# Patient Record
Sex: Female | Born: 1975 | Race: Black or African American | Hispanic: No | Marital: Married | Smoking: Former smoker
Health system: Southern US, Community
[De-identification: ages and names within clinical notes are randomized; demographics above are authoritative.]

## PROBLEM LIST (undated history)

## (undated) DIAGNOSIS — IMO0002 Reserved for concepts with insufficient information to code with codable children: Secondary | ICD-10-CM

## (undated) DIAGNOSIS — I10 Essential (primary) hypertension: Secondary | ICD-10-CM

## (undated) DIAGNOSIS — R011 Cardiac murmur, unspecified: Secondary | ICD-10-CM

## (undated) DIAGNOSIS — J45909 Unspecified asthma, uncomplicated: Secondary | ICD-10-CM

## (undated) DIAGNOSIS — D649 Anemia, unspecified: Secondary | ICD-10-CM

## (undated) HISTORY — DX: Unspecified asthma, uncomplicated: J45.909

## (undated) HISTORY — DX: Cardiac murmur, unspecified: R01.1

## (undated) HISTORY — PX: MYOMECTOMY: SHX85

## (undated) HISTORY — DX: Essential (primary) hypertension: I10

---

## 2006-06-26 ENCOUNTER — Emergency Department: Payer: Self-pay | Admitting: Emergency Medicine

## 2006-07-14 ENCOUNTER — Ambulatory Visit: Payer: Self-pay | Admitting: Internal Medicine

## 2006-07-26 ENCOUNTER — Ambulatory Visit: Payer: Self-pay | Admitting: Internal Medicine

## 2006-08-25 ENCOUNTER — Ambulatory Visit: Payer: Self-pay | Admitting: Internal Medicine

## 2006-09-25 ENCOUNTER — Ambulatory Visit: Payer: Self-pay | Admitting: Internal Medicine

## 2006-10-25 ENCOUNTER — Ambulatory Visit: Payer: Self-pay | Admitting: Internal Medicine

## 2007-08-27 ENCOUNTER — Ambulatory Visit: Payer: Self-pay | Admitting: Family Medicine

## 2007-11-08 ENCOUNTER — Emergency Department: Payer: Self-pay | Admitting: Internal Medicine

## 2008-04-12 ENCOUNTER — Inpatient Hospital Stay (HOSPITAL_COMMUNITY): Admission: RE | Admit: 2008-04-12 | Discharge: 2008-04-15 | Payer: Self-pay | Admitting: Obstetrics

## 2010-07-20 ENCOUNTER — Ambulatory Visit: Payer: Self-pay | Admitting: Internal Medicine

## 2010-09-08 NOTE — Op Note (Signed)
Monica Simmons, HAST NO.:  [id number]   MEDICAL RECORD NO.:  [id number]          PATIENT TYPE:  INP   LOCATION:  9147                          FACILITY:  [facility]   PHYSICIAN:  Lendon Colonel, MD   DATE OF BIRTH:  22-Oct-1975   DATE OF PROCEDURE:  DATE OF DISCHARGE:                               OPERATIVE REPORT   PREOPERATIVE DIAGNOSES:  Breech intrauterine pregnancy at 38 weeks and 1  day, prior extensive myomectomy.   POSTOPERATIVE DIAGNOSES:  Breech intrauterine pregnancy at 38 weeks and  1 day, prior extensive myomectomy.   PROCEDURE:  1. Primary low transverse cesarean section.  2. Extensive lysis of adhesions.   SURGEON:  Lendon Colonel, MD   ASSISTANT:  Eliberto Ivory. Rosalio Macadamia, MD   SECONDARY ASSISTANT:  Genia Del, MD   ANESTHESIA:  Spinal.   FINDINGS:  Female infant in the RST position, Apgars 8 and 9, 6 pounds 13  ounces.  Normal placenta.  Distorted uterine cavity with multiple  subserosal, submucosal, and intramural fibroids, thickened fibrotic  myometrium with excessive scar tissue within the myometrium.  Dense  adhesions between the uterus and the anterior abdominal wall, the  bladder to the uterus, the bladder to the anterior abdominal wall.  Filmy bowel adhesions.  Normal placenta, clear amniotic fluid, normal  tubes and ovaries.   SPECIMENS:  Placenta to Labor and Delivery.   FINDINGS:  As above.   ANTIBIOTICS:  Ancef 2 g.   ESTIMATED BLOOD LOSS:  900 mL.   COMPLICATIONS:  None.   PROCEDURE PERFORMED:  After informed consent was obtained for the  procedure and risks including excessive bleeding, need for blood  transfusion, hysterectomy were discussed with the patient.  The patient  was taken to the operating room where spinal anesthesia was administered  without difficulty.  She was prepped and draped in the normal sterile  fashion in the dorsal supine position with a leftward tilt.  A Foley  catheter was inserted  sterilely into the bladder.  A Pfannenstiel skin  incision was made 2 cm above the pubic symphysis with the scalpel and  carried through to the underlying layer of fascia with the Bovie  cautery.  The fascia was incised in the midline.  The incision extended  laterally with the Mayo scissors.  No clear midline from the rectus  muscles were noted; however, dense adhesions were noted between the  rectus muscles.  Pickups and sharp dissection with the knife were used  to better identify any planes.  The superior aspect of fascia incision  was grasped with Kocher clamps, elevated up, and the rectus muscles were  separated off sharply.  The inferior aspect of the fascial incision was  also grasped with the Kocher clamps, and the rectus muscles were  dissected off sharply with the Mayo scissors.  Again, attempt at  dividing the rectus muscles was done very carefully.  Some superior  dissection was done.  Some dense bands were noted.  The combination of  blunt and sharp dissection was done to enter the peritoneal cavity.  During this dissection, it was  thought that mostly uterine serosa and  myometrium was being divided.  Before each cut, the care was taken to  ensure no bowel was present.  For the inferior dissection, again care  was taken to ensure the bladder was free.  The bladder was dissected  both bluntly and sharply.  Once ensuring nothing was under a very large  adhesion between the uterus in the anterior abdominal wall, this was  taken down with a combination of Mayo scissors, Bovie cautery, and  scalpel.  Approximately an additional 40 minutes was done in this  careful lysis of adhesions.  Once adequate room was noted, the  vesicouterine peritoneum was grasped with pickups, entered sharply, and  extended laterally.  The bladder flap was created digitally.  The  bladder blade was reinserted.  Manual exam of the uterus to assess the  position of the fetus was done.  A preoperative  ultrasound confirmed  breech position, and due to the thickness of the uterine myometrium and  the multiple fibroids, it was difficult to identify fetal parts in the  uterus.  Position of the vessels were noted, however.  The lower uterine  segment was incised in a transverse fashion with the scalpel.  This was  quite thick, and Allis clamps had to be placed on the inferior and  superior muscle edges.  These were held in traction to continue the  dissection down to the uterine and the amniotic cavity.  Once entered,  the uterine incision was extended anterior and laterally with the  bandage scissors with care being taken to avoid cutting into the  fibroids.  The amniotic sac was then ruptured, and clear fluid was  noted. In an attempt to deliver the baby, the right arm was presented,  this was switched back into the uterus, and the baby was delivered in  the frank breech position.  After delivery of the sacrum in the LST  position, the baby was rotated with the left arm swept across the chest  and then the right arm delivered and the head delivered in traction.  The cord was clamped and cut.  Infant was taken to the waiting  pediatrician.  The placenta was expressed.  The uterus was unable to be  delivered due to  the adhesions of the posterior uterus to the small  intestines.  The bladder blade was reinserted.  The uterus was cleaned  with a dry lap sponge, and retractors were used along with T clamps  along the uterine incision.  Uterine incision was repaired with 0 Vicryl  in a running locked fashion.  A second layer of the same suture was used  in a vertical imbricating fashion.  Good hemostasis of the uterine  incision was noted.  The left lateral segments extending from the  uterine incision up towards the fundus in a vertical fashion  approximately a 4-cm defect was noted in the myometrium with brisk  bleeding.  This was repaired with a running locked suture of 0 Vicryl.   Additional 4 figure-of-eight sutures were used to bring this myometrium  together for  good hemostasis throughout the anterior surface of the  uterus.  Diffuse bleeding was noted from the multiple lysis of  adhesions. An additional 30 minutes were spent in obtaining hemostasis  with a combination of figure-of-eight sutures and Bovie cautery, and  total of additional 8 sutures in figure-of-eight fashion were sewn in  the uterine muscle for hemostasis.  The pelvis was irrigated with warm  normal saline.  The bladder flap was inspected and found to be  hemostatic.  The bladder was then backfilled with 400 mL of saline  tinged with methylene blue.  No defects were noted in the bladder.  A  piece of Surgicel was placed over the anterior uterine serosa and  hemostasis noted.  Additional piece of Surgicel was placed in the lower  uterine segment especially on the right edge where the bladder was taken  off sharply to control some hemostasis.  Given the amount of scar  tissue, a piece of Interceed was then laid on top of this.  Hemostasis  was noted.  The rectus muscles were reapproximated with four interrupted  loose 2-0 Vicryl sutures.  The fascia was closed with 0 Vicryl in  a running fashion and two half of subcutaneous tissue was irrigated.  The subcutaneous space was closed with a 2-0 plain gut on a large  needle, and the skin was closed with staples.  The patient tolerated the  procedure well.  Sponge, lap, and needle counts were correct x3.  The  patient was taken to the recovery room in stable condition.      Lendon Colonel, MD  Electronically Signed     KAF/MEDQ  D:  04/12/2008  T:  04/13/2008  Job:  [id number]

## 2010-09-11 NOTE — Discharge Summary (Signed)
Monica Simmons, Monica Simmons NO.:  [id number]   MEDICAL RECORD NO.:  [id number]          PATIENT TYPE:  INP   LOCATION:  9147                          FACILITY:  [facility]   PHYSICIAN:  Lendon Colonel, MD   DATE OF BIRTH:  02-06-1976   DATE OF ADMISSION:  04/12/2008  DATE OF DISCHARGE:  04/15/2008                               DISCHARGE SUMMARY   CHIEF COMPLAINT:  Here for primary cesarean section.   HISTORY OF PRESENT ILLNESS:  This is a 35 year old G1 at 54 weeks and 1  day with history of prior myomectomy with 25 fibroid removal here for  primary cesarean section given the risks of uterine rupture with  contractions.   PAST MEDICAL HISTORY:  Significant for fibroids, anemia, increased  weight gain in pregnancy, chronic hypertension, and breech presentation.  Socially married with no toxic habits.  No known drug allergies.   MEDICATIONS:  Prenatal vitamins and iron on admission.   She was afebrile with stable vital signs.  Her abdomen is gravid,  enlarged, nontender.  The patient was admitted for a c-section for  breech with prior myomectomy.  The patient did well, had an  uncomplicated cesarean section that can be found in a separate operative  note.  In short, she had a baby in the LST position with Apgars 8/9,  weight 6 pounds 13 ounces with extensive pelvic adhesions.  Postoperatively, the patient did well and by postoperative day #3, the  patient was ambulating and voiding.  She had stable vital signs.  Her  pain was controlled with p.o. medicine.  She did have some dependent  edema in the pannus and some 2+ lower extremity edema; however, the  patient was discharged to home with plans for followup in the office for  staple removal.   DISCHARGE MEDICATIONS:  Motrin, Percocet, and Colace.   DISCHARGE INSTRUCTIONS:  Return for staple removal few days after the  discharge.   DISCHARGE CONDITION:  Stable.   DISCHARGE DISPOSITION:  Home.   DISCHARGE DIAGNOSIS:   Status post primary cesarean section.      Lendon Colonel, MD  Electronically Signed     KAF/MEDQ  D:  05/26/2008  T:  05/26/2008  Job:  [id number]

## 2010-12-21 ENCOUNTER — Encounter: Payer: Self-pay | Admitting: Family Medicine

## 2010-12-21 ENCOUNTER — Ambulatory Visit (INDEPENDENT_AMBULATORY_CARE_PROVIDER_SITE_OTHER): Payer: BC Managed Care – PPO | Admitting: Family Medicine

## 2010-12-21 VITALS — BP 110/78 | HR 86 | Temp 97.8°F | Ht 61.0 in | Wt 201.8 lb

## 2010-12-21 DIAGNOSIS — Z0000 Encounter for general adult medical examination without abnormal findings: Secondary | ICD-10-CM

## 2010-12-21 DIAGNOSIS — Q838 Other congenital malformations of breast: Secondary | ICD-10-CM

## 2010-12-21 DIAGNOSIS — Z136 Encounter for screening for cardiovascular disorders: Secondary | ICD-10-CM

## 2010-12-21 NOTE — Progress Notes (Signed)
  Subjective:    Patient ID: Monica Simmons, female    DOB: [birth year], 35 y.o.   MRN: [id number]  HPI  35 yo here to establish care.  G2P1- 2 yo son. Sees Dr. Payton Doughty. UTD with pap smear. She and her husband are trying to get pregnant, not using any contraception.  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 Good supportive home environment.  Left breast accessory fatty tissue-  Has been growing in size since college. Not painful.      Review of Systems See HPI Patient reports no  vision/ hearing changes,anorexia, weight change, fever ,adenopathy, persistant / recurrent hoarseness, swallowing issues, chest pain, edema,persistant / recurrent cough, hemoptysis, dyspnea(rest, exertional, paroxysmal nocturnal), gastrointestinal  bleeding (melena, rectal bleeding), abdominal pain, excessive heart burn, GU symptoms(dysuria, hematuria, pyuria, voiding/incontinence  Issues) syncope, focal weakness, severe memory loss, concerning skin lesions, depression, anxiety, abnormal bruising/bleeding, major joint swelling, breast masses or abnormal vaginal bleeding.       Objective:   Physical Exam BP 110/78  Pulse 86  Temp(Src) 97.8 F (36.6 C) (Oral)  Ht 5\' 1"  (1.549 m)  Wt 201 lb 12 oz (91.513 kg)  BMI 38.12 kg/m2  LMP 12/19/2010  General:  Well-developed,overweight appearing,in no acute distress; alert,appropriate and cooperative throughout examination Head:  normocephalic and atraumatic.   Eyes:  vision grossly intact, pupils equal, pupils round, and pupils reactive to light.   Ears:  R ear normal and L ear normal.   Nose:  no external deformity.   Mouth:  good dentition.   Neck:  No deformities, masses, or tenderness noted. Breasts:  Adjacent to left breast, under axilla- accessory fatty tissue, non tender to palpation Lungs:  Normal respiratory effort, chest expands symmetrically. Lungs are clear to auscultation, no crackles or wheezes. Heart:  Normal rate and regular rhythm. S1 and S2 normal  without gallop, murmur, click, rub or other extra sounds. Abdomen:  Bowel sounds positive,abdomen soft and non-tender without masses, organomegaly or hernias noted. Msk:  No deformity or scoliosis noted of thoracic or lumbar spine.   Extremities:  No clubbing, cyanosis, edema, or deformity noted with normal full range of motion of all joints.   Neurologic:  alert & oriented X3 and gait normal.   Skin:  Intact without suspicious lesions or rashes Cervical Nodes:  No lymphadenopathy noted Axillary Nodes:  No palpable lymphadenopathy Psych:  Cognition and judgment appear intact. Alert and cooperative with normal attention span and concentration. No apparent delusions, illusions, hallucinations        Assessment & Plan:   1. Routine general medical examination at a health care facility  Reviewed preventive care protocols, scheduled due services, and updated immunizations Discussed nutrition, exercise, diet, and healthy lifestyle.   Basic Metabolic Panel (BMET)  2. Ectopic breast tissue   Will refer to general surgery for removal. Ambulatory referral to General Surgery

## 2010-12-21 NOTE — Patient Instructions (Signed)
Great to meet you. Please stop by to see Monica Simmons on your way out. 

## 2010-12-22 LAB — LIPID PANEL
Cholesterol: 116 mg/dL (ref 0–200)
LDL Cholesterol: 61 mg/dL (ref 0–99)

## 2010-12-22 LAB — BASIC METABOLIC PANEL
BUN: 10 mg/dL (ref 6–23)
Chloride: 108 mEq/L (ref 96–112)
Creatinine, Ser: 0.6 mg/dL (ref 0.4–1.2)
GFR: 122.33 mL/min (ref >60.00)

## 2011-01-12 ENCOUNTER — Ambulatory Visit (INDEPENDENT_AMBULATORY_CARE_PROVIDER_SITE_OTHER): Payer: BC Managed Care – PPO | Admitting: General Surgery

## 2011-01-12 ENCOUNTER — Other Ambulatory Visit (INDEPENDENT_AMBULATORY_CARE_PROVIDER_SITE_OTHER): Payer: Self-pay | Admitting: General Surgery

## 2011-01-12 DIAGNOSIS — M549 Dorsalgia, unspecified: Secondary | ICD-10-CM

## 2011-01-29 LAB — CROSSMATCH: Antibody Screen: NEGATIVE

## 2011-01-29 LAB — ABO/RH: ABO/RH(D): O POS

## 2011-01-29 LAB — CREATININE, SERUM: GFR calc non Af Amer: >60 mL/min (ref >60)

## 2011-01-29 LAB — CBC
HCT: 27.4 % — ABNORMAL LOW (ref 36.0–46.0)
HCT: 27.8 % — ABNORMAL LOW (ref 36.0–46.0)
HCT: 31.2 % — ABNORMAL LOW (ref 36.0–46.0)
HCT: 35.9 % — ABNORMAL LOW (ref 36.0–46.0)
Hemoglobin: 10.4 g/dL — ABNORMAL LOW (ref 12.0–15.0)
Hemoglobin: 9.1 g/dL — ABNORMAL LOW (ref 12.0–15.0)
Hemoglobin: 9.3 g/dL — ABNORMAL LOW (ref 12.0–15.0)
MCHC: 33.4 g/dL (ref 30.0–36.0)
MCV: 85.1 fL (ref 78.0–100.0)
MCV: 85.2 fL (ref 78.0–100.0)
MCV: 85.3 fL (ref 78.0–100.0)
Platelets: 223 10*3/uL (ref 150–400)
RBC: 3.67 MIL/uL — ABNORMAL LOW (ref 3.87–5.11)
RDW: 14.5 % (ref 11.5–15.5)
RDW: 15.2 % (ref 11.5–15.5)
WBC: 14 10*3/uL — ABNORMAL HIGH (ref 4.0–10.5)
WBC: 9 10*3/uL (ref 4.0–10.5)

## 2011-01-29 LAB — URINALYSIS, ROUTINE W REFLEX MICROSCOPIC
Glucose, UA: NEGATIVE mg/dL
Protein, ur: NEGATIVE mg/dL

## 2011-05-10 LAB — OB RESULTS CONSOLE HIV ANTIBODY (ROUTINE TESTING): HIV: NONREACTIVE

## 2011-05-10 LAB — OB RESULTS CONSOLE ABO/RH

## 2011-05-10 LAB — OB RESULTS CONSOLE ANTIBODY SCREEN: Antibody Screen: NEGATIVE

## 2011-08-03 ENCOUNTER — Other Ambulatory Visit (HOSPITAL_COMMUNITY): Payer: Self-pay | Admitting: Obstetrics

## 2011-08-03 ENCOUNTER — Other Ambulatory Visit: Payer: Self-pay

## 2011-08-03 DIAGNOSIS — O2690 Pregnancy related conditions, unspecified, unspecified trimester: Secondary | ICD-10-CM

## 2011-08-03 DIAGNOSIS — Z3689 Encounter for other specified antenatal screening: Secondary | ICD-10-CM

## 2011-08-04 ENCOUNTER — Encounter (HOSPITAL_COMMUNITY): Payer: Self-pay | Admitting: Obstetrics

## 2011-08-17 ENCOUNTER — Ambulatory Visit (HOSPITAL_COMMUNITY)
Admission: RE | Admit: 2011-08-17 | Discharge: 2011-08-17 | Disposition: A | Discharge status: Home / Self Care | Payer: BC Managed Care – PPO | Source: Ambulatory Visit | Attending: Obstetrics | Admitting: Obstetrics

## 2011-08-17 ENCOUNTER — Encounter (HOSPITAL_COMMUNITY): Payer: Self-pay

## 2011-08-17 ENCOUNTER — Ambulatory Visit (HOSPITAL_COMMUNITY): Payer: BC Managed Care – PPO

## 2011-08-17 ENCOUNTER — Other Ambulatory Visit (HOSPITAL_COMMUNITY): Payer: BC Managed Care – PPO

## 2011-08-17 DIAGNOSIS — O358XX0 Maternal care for other (suspected) fetal abnormality and damage, not applicable or unspecified: Secondary | ICD-10-CM | POA: Insufficient documentation

## 2011-08-17 DIAGNOSIS — O10019 Pre-existing essential hypertension complicating pregnancy, unspecified trimester: Secondary | ICD-10-CM | POA: Insufficient documentation

## 2011-08-17 DIAGNOSIS — O2690 Pregnancy related conditions, unspecified, unspecified trimester: Secondary | ICD-10-CM

## 2011-08-17 DIAGNOSIS — Z3689 Encounter for other specified antenatal screening: Secondary | ICD-10-CM

## 2011-08-17 DIAGNOSIS — O3410 Maternal care for benign tumor of corpus uteri, unspecified trimester: Secondary | ICD-10-CM | POA: Insufficient documentation

## 2011-08-17 DIAGNOSIS — O34219 Maternal care for unspecified type scar from previous cesarean delivery: Secondary | ICD-10-CM | POA: Insufficient documentation

## 2011-08-17 HISTORY — DX: Anemia, unspecified: D64.9

## 2011-08-17 NOTE — Progress Notes (Addendum)
Genetic Counseling  High-Risk Gestation Note  Appointment Date:  08/17/2011 Referred By: Noland Fordyce, MD Date of Birth:  Sep 20, 1975    Pregnancy History: [id number] Estimated Date of Delivery: 12/16/11 Estimated Gestational Age: [redacted]w[redacted]d Attending: Particia Nearing, MD  Monica Simmons, was seen for genetic counseling given the previous ultrasound findings of echogenic intracardiac focus and pyelectasis.   Ultrasound performed today confirmed the pregnancy to be [redacted]w[redacted]d and confirmed the findings of echogenic intracardiac focus and bilateral pyelectasis. Remaining visualized fetal anatomy appeared normal. Complete ultrasound results reported separately.   An isolated echogenic intracardiac focus (EIF) is generally believed to be a normal variation without any concerns for the pregnancy.  Isolated echogenic cardiac foci are not associated with congenital heart defects in the baby or compromised cardiac function after birth.  However, an echogenic cardiac focus is associated with a slightly increased chance for Down syndrome in the pregnancy when additional risk factors for aneuploidy are present. We discussed that fetal pyelectasis is defined as the dilatation of the fetal renal pelvis/pelvises due to excess urine. This finding is estimated to occur in 2-3% of fetuses.  The female to female ratio is 2:1.  Typically, babies with mild pyelectasis are born normal and healthy and we are usually unable to determine why this extra fluid is present.  This urine accumulation may regress, stay the same or continue to accumulate.  The more fluid that accumulates, the more likely this fluid could be the result of a compromise in kidney function, an obstruction, or narrowing of the ureters which transport urine out of the body, thus causing backflow of fluid into the kidneys.  Therefore, it is important to follow pyelectasis to make sure it does not become more concerning.  Also, in some cases postnatal evaluation of  baby's kidneys may be warranted.  We discussed that the finding of pyelectasis is associated with an increased risk for fetal aneuploidy.  This risk is highest when other anomalies or fetal differences are visualized.  We reviewed chromosomes, nondisjunction, and Down syndrome including the common features and variable prognosis. We also reviewed Monica Simmons's first trimester screen result and the associated reduction in risks for fetal Down syndrome (1 in 362 to 1 in 7221) and trisomy 18/13 (1 in 629 to 1 in >10,000).  She understands that first trimester screening provides a pregnancy specific risk for these conditions, but is not considered to be diagnostic.    She was counseled regarding other available screening and diagnostic options including cell free fetal DNA testing and amniocentesis.  The risks, benefits, and limitations of each of these options were reviewed in detail. We reviewed that the findings of  EIF and pyelectasis would be associated with an increased risk for Down syndrome above the patient's first trimester screen result of 1 in 7,221. However, the adjusted risk is still less than the patient's a priori risk based on age and is less than the complication risk associated with amniocentesis.  After thoughtful consideration of these options, she elected to proceed with ultrasound, but declined amniocentesis and cell free fetal DNA testing.  She understands that screening tests cannot rule out all birth defects or genetic syndromes.  Monica Simmons was provided with written information regarding sickle cell anemia (SCA) including the carrier frequency and incidence in the African-American population, the availability of carrier testing and prenatal diagnosis if indicated.  In addition, we discussed that hemoglobinopathies are routinely screened for as part of the [place] newborn screening panel.  She declined  additional discussion of sickle cell screening today.   Both family histories  were reviewed and found to be noncontributory for birth defects, mental retardation, and known genetic conditions. Without further information regarding the provided family history, an accurate genetic risk cannot be calculated. Further genetic counseling is warranted if more information is obtained.  Monica Simmons denied exposure to environmental toxins or chemical agents. She denied the use of alcohol, tobacco or street drugs. She denied significant viral illnesses during the course of her pregnancy. Her medical and surgical histories were noncontributory.    I counseled Monica Simmons regarding the above risks and available options.  The approximate face-to-face time with the genetic counselor was 16 minutes.  Quinn Plowman, MS Certified Genetic Counselor 08/17/2011

## 2011-08-20 ENCOUNTER — Ambulatory Visit (HOSPITAL_COMMUNITY): Payer: BC Managed Care – PPO

## 2011-08-23 ENCOUNTER — Other Ambulatory Visit (HOSPITAL_COMMUNITY): Payer: BC Managed Care – PPO

## 2011-08-24 ENCOUNTER — Ambulatory Visit (HOSPITAL_COMMUNITY)
Admission: RE | Admit: 2011-08-24 | Discharge: 2011-08-24 | Disposition: A | Discharge status: Home / Self Care | Payer: BC Managed Care – PPO | Source: Ambulatory Visit | Attending: Obstetrics | Admitting: Obstetrics

## 2011-08-24 ENCOUNTER — Other Ambulatory Visit: Payer: Self-pay

## 2011-08-24 DIAGNOSIS — O10019 Pre-existing essential hypertension complicating pregnancy, unspecified trimester: Secondary | ICD-10-CM | POA: Insufficient documentation

## 2011-08-24 DIAGNOSIS — O3410 Maternal care for benign tumor of corpus uteri, unspecified trimester: Secondary | ICD-10-CM | POA: Insufficient documentation

## 2011-08-24 DIAGNOSIS — O34219 Maternal care for unspecified type scar from previous cesarean delivery: Secondary | ICD-10-CM | POA: Insufficient documentation

## 2011-08-24 DIAGNOSIS — O358XX0 Maternal care for other (suspected) fetal abnormality and damage, not applicable or unspecified: Secondary | ICD-10-CM | POA: Insufficient documentation

## 2011-09-03 ENCOUNTER — Telehealth (HOSPITAL_COMMUNITY): Payer: Self-pay | Admitting: MS"

## 2011-09-03 NOTE — Telephone Encounter (Signed)
Called Monica Simmons to discuss her [facility], cell free fetal DNA testing.  We reviewed that these are within normal limits, showing a less than 1 in 10,000 risk for trisomies 21, 18 and 13.  We reviewed that this testing identifies > 99% of pregnancies with trisomy 21, >97% of pregnancies with trisomy 2, and >80% with trisomy 25; the false positive rate is <0.1% for all conditions.  She understands that this testing does not identify all genetic conditions.  All questions were answered to her satisfaction, she was encouraged to call with additional questions or concerns.  Quinn Plowman, MS Patent attorney

## 2011-09-28 ENCOUNTER — Encounter: Payer: Self-pay | Admitting: *Deleted

## 2011-11-03 ENCOUNTER — Other Ambulatory Visit: Payer: Self-pay

## 2011-11-04 ENCOUNTER — Other Ambulatory Visit (HOSPITAL_COMMUNITY): Payer: Self-pay | Admitting: Obstetrics

## 2011-11-04 DIAGNOSIS — O4440 Low lying placenta NOS or without hemorrhage, unspecified trimester: Secondary | ICD-10-CM

## 2011-11-08 ENCOUNTER — Ambulatory Visit (HOSPITAL_COMMUNITY): Payer: BC Managed Care – PPO

## 2011-11-11 ENCOUNTER — Ambulatory Visit (HOSPITAL_COMMUNITY)
Admission: RE | Admit: 2011-11-11 | Discharge: 2011-11-11 | Disposition: A | Discharge status: Home / Self Care | Payer: BC Managed Care – PPO | Source: Ambulatory Visit | Attending: Obstetrics | Admitting: Obstetrics

## 2011-11-11 ENCOUNTER — Other Ambulatory Visit (HOSPITAL_COMMUNITY): Payer: Self-pay | Admitting: Obstetrics

## 2011-11-11 DIAGNOSIS — O10019 Pre-existing essential hypertension complicating pregnancy, unspecified trimester: Secondary | ICD-10-CM | POA: Insufficient documentation

## 2011-11-11 DIAGNOSIS — O4440 Low lying placenta NOS or without hemorrhage, unspecified trimester: Secondary | ICD-10-CM

## 2011-11-11 DIAGNOSIS — O3410 Maternal care for benign tumor of corpus uteri, unspecified trimester: Secondary | ICD-10-CM | POA: Insufficient documentation

## 2011-11-11 DIAGNOSIS — O358XX0 Maternal care for other (suspected) fetal abnormality and damage, not applicable or unspecified: Secondary | ICD-10-CM | POA: Insufficient documentation

## 2011-11-11 DIAGNOSIS — O34219 Maternal care for unspecified type scar from previous cesarean delivery: Secondary | ICD-10-CM | POA: Insufficient documentation

## 2011-11-18 ENCOUNTER — Other Ambulatory Visit: Payer: Self-pay | Admitting: Obstetrics

## 2011-11-21 ENCOUNTER — Encounter (HOSPITAL_COMMUNITY): Payer: Self-pay

## 2011-11-22 ENCOUNTER — Encounter (HOSPITAL_COMMUNITY)
Admission: RE | Admit: 2011-11-22 | Discharge: 2011-11-22 | Disposition: A | Discharge status: Home / Self Care | Payer: BC Managed Care – PPO | Source: Ambulatory Visit | Attending: Obstetrics | Admitting: Obstetrics

## 2011-11-22 ENCOUNTER — Encounter (HOSPITAL_COMMUNITY): Payer: Self-pay

## 2011-11-22 LAB — CBC
HCT: 32.3 % — ABNORMAL LOW (ref 36.0–46.0)
MCV: 78.4 fL (ref 78.0–100.0)
RDW: 21.8 % — ABNORMAL HIGH (ref 11.5–15.5)
WBC: 8.6 10*3/uL (ref 4.0–10.5)

## 2011-11-22 LAB — SURGICAL PCR SCREEN
MRSA, PCR: POSITIVE — AB
Staphylococcus aureus: POSITIVE — AB

## 2011-11-22 NOTE — Patient Instructions (Addendum)
20 Monica Simmons  11/22/2011   Your procedure is scheduled on:  11/29/11  Enter through the Main Entrance of [facility] at 1000 AM.  Pick up the phone at the desk and dial [phone].   Call this number if you have problems the morning of surgery: [phone]   Remember:   Do not eat food:After Midnight.  Do not drink clear liquids: After Midnight.  Take these medicines the morning of surgery with A SIP OF WATER: NA   Do not wear jewelry, make-up or nail polish.  Do not wear lotions, powders, or perfumes. You may wear deodorant.  Do not shave 48 hours prior to surgery.  Do not bring valuables to the hospital.  Contacts, dentures or bridgework may not be worn into surgery.  Leave suitcase in the car. After surgery it may be brought to your room.  For patients admitted to the hospital, checkout time is 11:00 AM the day of discharge.   Patients discharged the day of surgery will not be allowed to drive home.  Name and phone number of your driver: NA  Special Instructions: CHG Shower Use Special Wash: 1/2 bottle night before surgery and 1/2 bottle morning of surgery.   Please read over the following fact sheets that you were given: MRSA Information

## 2011-11-23 LAB — RPR: RPR Ser Ql: NONREACTIVE

## 2011-11-29 ENCOUNTER — Inpatient Hospital Stay (HOSPITAL_COMMUNITY): Payer: BC Managed Care – PPO | Admitting: Anesthesiology

## 2011-11-29 ENCOUNTER — Encounter (HOSPITAL_COMMUNITY): Payer: Self-pay

## 2011-11-29 ENCOUNTER — Encounter (HOSPITAL_COMMUNITY)
Admission: RE | Disposition: A | Discharge status: Home / Self Care | Payer: Self-pay | Source: Ambulatory Visit | Attending: Obstetrics

## 2011-11-29 ENCOUNTER — Inpatient Hospital Stay (HOSPITAL_COMMUNITY)
Admission: RE | Admit: 2011-11-29 | Discharge: 2011-12-02 | DRG: 371 | Disposition: A | Discharge status: Home / Self Care | Payer: BC Managed Care – PPO | Source: Ambulatory Visit | Attending: Obstetrics | Admitting: Obstetrics

## 2011-11-29 ENCOUNTER — Encounter (HOSPITAL_COMMUNITY): Payer: Self-pay | Admitting: Anesthesiology

## 2011-11-29 DIAGNOSIS — O4100X0 Oligohydramnios, unspecified trimester, not applicable or unspecified: Secondary | ICD-10-CM | POA: Diagnosis present

## 2011-11-29 DIAGNOSIS — O909 Complication of the puerperium, unspecified: Secondary | ICD-10-CM | POA: Diagnosis present

## 2011-11-29 DIAGNOSIS — O34219 Maternal care for unspecified type scar from previous cesarean delivery: Principal | ICD-10-CM | POA: Diagnosis present

## 2011-11-29 DIAGNOSIS — O1002 Pre-existing essential hypertension complicating childbirth: Secondary | ICD-10-CM | POA: Diagnosis present

## 2011-11-29 DIAGNOSIS — IMO0002 Reserved for concepts with insufficient information to code with codable children: Secondary | ICD-10-CM | POA: Diagnosis present

## 2011-11-29 DIAGNOSIS — E669 Obesity, unspecified: Secondary | ICD-10-CM | POA: Diagnosis present

## 2011-11-29 HISTORY — DX: Reserved for concepts with insufficient information to code with codable children: IMO0002

## 2011-11-29 SURGERY — Surgical Case
Anesthesia: Spinal | Site: Abdomen | Wound class: Clean Contaminated

## 2011-11-29 MED ORDER — SCOPOLAMINE 1 MG/3DAYS TD PT72
1.0000 | MEDICATED_PATCH | Freq: Once | TRANSDERMAL | Status: DC
  Administered 2011-11-29: 1.5 mg via TRANSDERMAL

## 2011-11-29 MED ORDER — LANOLIN HYDROUS EX OINT: 1.0000 "application " | TOPICAL_OINTMENT | CUTANEOUS | Status: DC | PRN

## 2011-11-29 MED ORDER — OXYTOCIN 10 UNIT/ML IJ SOLN
40.0000 [IU] | INTRAVENOUS | Status: DC | PRN
  Administered 2011-11-29: 40 [IU] via INTRAVENOUS

## 2011-11-29 MED ORDER — FENTANYL CITRATE 0.05 MG/ML IJ SOLN
25.0000 ug | INTRAMUSCULAR | Status: DC | PRN
  Administered 2011-11-29: 50 ug via INTRAVENOUS

## 2011-11-29 MED ORDER — ONDANSETRON HCL 4 MG PO TABS: 4.0000 mg | ORAL_TABLET | ORAL | Status: DC | PRN

## 2011-11-29 MED ORDER — ONDANSETRON HCL 4 MG/2ML IJ SOLN
INTRAMUSCULAR | Status: DC | PRN
  Administered 2011-11-29: 4 mg via INTRAVENOUS

## 2011-11-29 MED ORDER — DIPHENHYDRAMINE HCL 50 MG/ML IJ SOLN: 25.0000 mg | INTRAMUSCULAR | Status: DC | PRN

## 2011-11-29 MED ORDER — SILVER NITRATE-POT NITRATE 75-25 % EX MISC
CUTANEOUS | Status: AC
  Filled 2011-11-29: qty 2

## 2011-11-29 MED ORDER — FENTANYL CITRATE 0.05 MG/ML IJ SOLN
INTRAMUSCULAR | Status: AC
  Filled 2011-11-29: qty 2

## 2011-11-29 MED ORDER — SODIUM CHLORIDE 0.9 % IJ SOLN: 3.0000 mL | INTRAMUSCULAR | Status: DC | PRN

## 2011-11-29 MED ORDER — LACTATED RINGERS IV SOLN
INTRAVENOUS | Status: DC | PRN
  Administered 2011-11-29: 13:00:00 via INTRAVENOUS

## 2011-11-29 MED ORDER — KETOROLAC TROMETHAMINE 30 MG/ML IJ SOLN
30.0000 mg | Freq: Four times a day (QID) | INTRAMUSCULAR | Status: AC | PRN
  Administered 2011-11-29: 30 mg via INTRAVENOUS

## 2011-11-29 MED ORDER — KETOROLAC TROMETHAMINE 30 MG/ML IJ SOLN: 30.0000 mg | Freq: Four times a day (QID) | INTRAMUSCULAR | Status: AC | PRN

## 2011-11-29 MED ORDER — TETANUS-DIPHTH-ACELL PERTUSSIS 5-2.5-18.5 LF-MCG/0.5 IM SUSP
0.5000 mL | Freq: Once | INTRAMUSCULAR | Status: AC
  Administered 2011-11-30: 0.5 mL via INTRAMUSCULAR
  Filled 2011-11-29: qty 0.5

## 2011-11-29 MED ORDER — WITCH HAZEL-GLYCERIN EX PADS: 1.0000 "application " | MEDICATED_PAD | CUTANEOUS | Status: DC | PRN

## 2011-11-29 MED ORDER — SIMETHICONE 80 MG PO CHEW
80.0000 mg | CHEWABLE_TABLET | Freq: Three times a day (TID) | ORAL | Status: DC
  Administered 2011-11-29 – 2011-12-02 (×10): 80 mg via ORAL

## 2011-11-29 MED ORDER — OXYTOCIN 40 UNITS IN LACTATED RINGERS INFUSION - SIMPLE MED: 62.5000 mL/h | INTRAVENOUS | Status: AC

## 2011-11-29 MED ORDER — FENTANYL CITRATE 0.05 MG/ML IJ SOLN
INTRAMUSCULAR | Status: DC | PRN
  Administered 2011-11-29: 25 ug via INTRATHECAL

## 2011-11-29 MED ORDER — PRENATAL MULTIVITAMIN CH
1.0000 | ORAL_TABLET | Freq: Every day | ORAL | Status: DC
  Administered 2011-11-30 – 2011-12-02 (×3): 1 via ORAL
  Filled 2011-11-29 (×3): qty 1

## 2011-11-29 MED ORDER — DIPHENHYDRAMINE HCL 50 MG/ML IJ SOLN
12.5000 mg | INTRAMUSCULAR | Status: DC | PRN
  Administered 2011-11-30: 12.5 mg via INTRAVENOUS
  Filled 2011-11-29: qty 1

## 2011-11-29 MED ORDER — OXYCODONE-ACETAMINOPHEN 5-325 MG PO TABS
1.0000 | ORAL_TABLET | ORAL | Status: DC | PRN
  Administered 2011-11-30 – 2011-12-01 (×5): 1 via ORAL
  Administered 2011-12-01: 2 via ORAL
  Administered 2011-12-02: 1 via ORAL
  Filled 2011-11-29: qty 1
  Filled 2011-11-29: qty 2
  Filled 2011-11-29 (×5): qty 1

## 2011-11-29 MED ORDER — FENTANYL CITRATE 0.05 MG/ML IJ SOLN
INTRAMUSCULAR | Status: DC | PRN
  Administered 2011-11-29: 75 ug via INTRAVENOUS

## 2011-11-29 MED ORDER — PHENYLEPHRINE 40 MCG/ML (10ML) SYRINGE FOR IV PUSH (FOR BLOOD PRESSURE SUPPORT)
PREFILLED_SYRINGE | INTRAVENOUS | Status: AC
  Filled 2011-11-29: qty 5

## 2011-11-29 MED ORDER — FENTANYL CITRATE 0.05 MG/ML IJ SOLN
INTRAMUSCULAR | Status: AC
  Administered 2011-11-29: 50 ug via INTRAVENOUS
  Filled 2011-11-29: qty 2

## 2011-11-29 MED ORDER — PHENYLEPHRINE HCL 10 MG/ML IJ SOLN
INTRAMUSCULAR | Status: DC | PRN
  Administered 2011-11-29 (×4): 80 ug via INTRAVENOUS
  Administered 2011-11-29: 40 ug via INTRAVENOUS
  Administered 2011-11-29: 80 ug via INTRAVENOUS
  Administered 2011-11-29: 40 ug via INTRAVENOUS
  Administered 2011-11-29 (×3): 80 ug via INTRAVENOUS
  Administered 2011-11-29 (×2): 40 ug via INTRAVENOUS

## 2011-11-29 MED ORDER — MORPHINE SULFATE 0.5 MG/ML IJ SOLN
INTRAMUSCULAR | Status: AC
  Filled 2011-11-29: qty 10

## 2011-11-29 MED ORDER — MORPHINE SULFATE (PF) 0.5 MG/ML IJ SOLN
INTRAMUSCULAR | Status: DC | PRN
  Administered 2011-11-29: 4.8 mg via INTRAVENOUS

## 2011-11-29 MED ORDER — ONDANSETRON HCL 4 MG/2ML IJ SOLN: 4.0000 mg | INTRAMUSCULAR | Status: DC | PRN

## 2011-11-29 MED ORDER — MIDAZOLAM HCL 2 MG/2ML IJ SOLN
INTRAMUSCULAR | Status: AC
  Filled 2011-11-29: qty 2

## 2011-11-29 MED ORDER — ONDANSETRON HCL 4 MG/2ML IJ SOLN
INTRAMUSCULAR | Status: AC
  Filled 2011-11-29: qty 2

## 2011-11-29 MED ORDER — OXYTOCIN 10 UNIT/ML IJ SOLN
INTRAMUSCULAR | Status: AC
  Filled 2011-11-29: qty 4

## 2011-11-29 MED ORDER — DIPHENHYDRAMINE HCL 25 MG PO CAPS: 25.0000 mg | ORAL_CAPSULE | Freq: Four times a day (QID) | ORAL | Status: DC | PRN

## 2011-11-29 MED ORDER — DIPHENHYDRAMINE HCL 25 MG PO CAPS
25.0000 mg | ORAL_CAPSULE | ORAL | Status: DC | PRN
  Administered 2011-11-30: 25 mg via ORAL
  Filled 2011-11-29: qty 1

## 2011-11-29 MED ORDER — PHENYLEPHRINE 40 MCG/ML (10ML) SYRINGE FOR IV PUSH (FOR BLOOD PRESSURE SUPPORT)
PREFILLED_SYRINGE | INTRAVENOUS | Status: AC
  Filled 2011-11-29: qty 10

## 2011-11-29 MED ORDER — CEFAZOLIN SODIUM-DEXTROSE 2-3 GM-% IV SOLR
INTRAVENOUS | Status: AC
  Filled 2011-11-29: qty 50

## 2011-11-29 MED ORDER — SCOPOLAMINE 1 MG/3DAYS TD PT72
MEDICATED_PATCH | TRANSDERMAL | Status: AC
  Administered 2011-11-29: 1.5 mg via TRANSDERMAL
  Filled 2011-11-29: qty 1

## 2011-11-29 MED ORDER — MORPHINE SULFATE (PF) 0.5 MG/ML IJ SOLN
INTRAMUSCULAR | Status: DC | PRN
  Administered 2011-11-29: .2 mg via INTRATHECAL

## 2011-11-29 MED ORDER — MENTHOL 3 MG MT LOZG: 1.0000 | LOZENGE | OROMUCOSAL | Status: DC | PRN

## 2011-11-29 MED ORDER — METOCLOPRAMIDE HCL 5 MG/ML IJ SOLN: 10.0000 mg | Freq: Three times a day (TID) | INTRAMUSCULAR | Status: DC | PRN

## 2011-11-29 MED ORDER — CEFAZOLIN SODIUM-DEXTROSE 2-3 GM-% IV SOLR
2.0000 g | Freq: Once | INTRAVENOUS | Status: AC
  Administered 2011-11-29: 2 g via INTRAVENOUS
  Filled 2011-11-29: qty 50

## 2011-11-29 MED ORDER — NALBUPHINE SYRINGE 5 MG/0.5 ML
5.0000 mg | INJECTION | INTRAMUSCULAR | Status: DC | PRN
  Filled 2011-11-29: qty 1

## 2011-11-29 MED ORDER — MEPERIDINE HCL 25 MG/ML IJ SOLN: 6.2500 mg | INTRAMUSCULAR | Status: DC | PRN

## 2011-11-29 MED ORDER — SILVER NITRATE-POT NITRATE 75-25 % EX MISC
CUTANEOUS | Status: AC
  Filled 2011-11-29: qty 1

## 2011-11-29 MED ORDER — SILVER NITRATE-POT NITRATE 75-25 % EX MISC
CUTANEOUS | Status: DC | PRN
  Administered 2011-11-29: 2 via TOPICAL

## 2011-11-29 MED ORDER — SODIUM CHLORIDE 0.9 % IV SOLN
1.0000 ug/kg/h | INTRAVENOUS | Status: DC | PRN
  Filled 2011-11-29: qty 2.5

## 2011-11-29 MED ORDER — DIBUCAINE 1 % RE OINT: 1.0000 "application " | TOPICAL_OINTMENT | RECTAL | Status: DC | PRN

## 2011-11-29 MED ORDER — KETOROLAC TROMETHAMINE 30 MG/ML IJ SOLN
INTRAMUSCULAR | Status: AC
  Filled 2011-11-29: qty 1

## 2011-11-29 MED ORDER — LACTATED RINGERS IV SOLN: INTRAVENOUS | Status: DC

## 2011-11-29 MED ORDER — LACTATED RINGERS IV SOLN
INTRAVENOUS | Status: DC
  Administered 2011-11-29 (×4): via INTRAVENOUS

## 2011-11-29 MED ORDER — BUPIVACAINE IN DEXTROSE 0.75-8.25 % IT SOLN
INTRATHECAL | Status: DC | PRN
  Administered 2011-11-29: 1.5 mg via INTRATHECAL

## 2011-11-29 MED ORDER — ZOLPIDEM TARTRATE 5 MG PO TABS: 5.0000 mg | ORAL_TABLET | Freq: Every evening | ORAL | Status: DC | PRN

## 2011-11-29 MED ORDER — KETOROLAC TROMETHAMINE 60 MG/2ML IM SOLN
60.0000 mg | Freq: Once | INTRAMUSCULAR | Status: AC | PRN
  Filled 2011-11-29: qty 2

## 2011-11-29 MED ORDER — NALOXONE HCL 0.4 MG/ML IJ SOLN: 0.4000 mg | INTRAMUSCULAR | Status: DC | PRN

## 2011-11-29 MED ORDER — FENTANYL CITRATE 0.05 MG/ML IJ SOLN
INTRAMUSCULAR | Status: AC
  Filled 2011-11-29: qty 10

## 2011-11-29 MED ORDER — SIMETHICONE 80 MG PO CHEW: 80.0000 mg | CHEWABLE_TABLET | ORAL | Status: DC | PRN

## 2011-11-29 MED ORDER — IBUPROFEN 600 MG PO TABS
600.0000 mg | ORAL_TABLET | Freq: Four times a day (QID) | ORAL | Status: DC
  Administered 2011-11-29 – 2011-12-02 (×11): 600 mg via ORAL
  Filled 2011-11-29 (×11): qty 1

## 2011-11-29 MED ORDER — NALBUPHINE SYRINGE 5 MG/0.5 ML
5.0000 mg | INJECTION | INTRAMUSCULAR | Status: DC | PRN
  Administered 2011-11-29: 5 mg via SUBCUTANEOUS
  Filled 2011-11-29 (×2): qty 1

## 2011-11-29 MED ORDER — SENNOSIDES-DOCUSATE SODIUM 8.6-50 MG PO TABS
2.0000 | ORAL_TABLET | Freq: Every day | ORAL | Status: DC
  Administered 2011-11-29 – 2011-12-01 (×3): 2 via ORAL

## 2011-11-29 MED ORDER — ONDANSETRON HCL 4 MG/2ML IJ SOLN: 4.0000 mg | Freq: Three times a day (TID) | INTRAMUSCULAR | Status: DC | PRN

## 2011-11-29 SURGICAL SUPPLY — 39 items
BARRIER ADHS 3X4 INTERCEED (GAUZE/BANDAGES/DRESSINGS) ×2 IMPLANT
CHLORAPREP W/TINT 26ML (MISCELLANEOUS) ×2 IMPLANT
CLOTH BEACON ORANGE TIMEOUT ST (SAFETY) ×2 IMPLANT
CONTAINER PREFILL 10% NBF 15ML (MISCELLANEOUS) ×2 IMPLANT
DRSG COVADERM 4X10 (GAUZE/BANDAGES/DRESSINGS) ×2 IMPLANT
ELECT REM PT RETURN 9FT ADLT (ELECTROSURGICAL) ×2
ELECTRODE REM PT RTRN 9FT ADLT (ELECTROSURGICAL) ×1 IMPLANT
EXTRACTOR VACUUM M CUP 4 TUBE (SUCTIONS) ×2 IMPLANT
GLOVE BIO SURGEON STRL SZ 6.5 (GLOVE) ×2 IMPLANT
GLOVE BIOGEL PI IND STRL 7.0 (GLOVE) ×2 IMPLANT
GLOVE BIOGEL PI INDICATOR 7.0 (GLOVE) ×2
GLOVE INDICATOR 7.0 STRL GRN (GLOVE) ×4 IMPLANT
GLOVE SURG SS PI 6.5 STRL IVOR (GLOVE) ×4 IMPLANT
GOWN PREVENTION PLUS LG XLONG (DISPOSABLE) ×6 IMPLANT
HEMOSTAT SURGICEL 4X8 (HEMOSTASIS) ×2 IMPLANT
KIT ABG SYR 3ML LUER SLIP (SYRINGE) IMPLANT
NEEDLE HYPO 25X5/8 SAFETYGLIDE (NEEDLE) IMPLANT
NS IRRIG 1000ML POUR BTL (IV SOLUTION) ×2 IMPLANT
PACK C SECTION WH (CUSTOM PROCEDURE TRAY) ×2 IMPLANT
PAD ABD 7.5X8 STRL (GAUZE/BANDAGES/DRESSINGS) ×2 IMPLANT
SLEEVE SCD COMPRESS KNEE MED (MISCELLANEOUS) IMPLANT
SPONGE GAUZE 4X4 12PLY (GAUZE/BANDAGES/DRESSINGS) ×2 IMPLANT
SPONGE LAP 18X18 X RAY DECT (DISPOSABLE) ×6 IMPLANT
STAPLER VISISTAT 35W (STAPLE) IMPLANT
STRIP CLOSURE SKIN 1/2X4 (GAUZE/BANDAGES/DRESSINGS) IMPLANT
SUT MON AB 3-0 SH 27 (SUTURE) ×1
SUT MON AB 3-0 SH27 (SUTURE) ×1 IMPLANT
SUT MON AB 4-0 PS1 27 (SUTURE) IMPLANT
SUT PLAIN 0 NONE (SUTURE) IMPLANT
SUT PLAIN 2 0 XLH (SUTURE) ×2 IMPLANT
SUT VIC AB 0 CT1 36 (SUTURE) ×4 IMPLANT
SUT VIC AB 0 CTX 36 (SUTURE) ×3
SUT VIC AB 0 CTX36XBRD ANBCTRL (SUTURE) ×3 IMPLANT
SUT VIC AB 2-0 CT1 27 (SUTURE) ×2
SUT VIC AB 2-0 CT1 TAPERPNT 27 (SUTURE) ×2 IMPLANT
TAPE CLOTH SURG 4X10 WHT LF (GAUZE/BANDAGES/DRESSINGS) ×2 IMPLANT
TOWEL OR 17X24 6PK STRL BLUE (TOWEL DISPOSABLE) ×4 IMPLANT
TRAY FOLEY CATH 14FR (SET/KITS/TRAYS/PACK) IMPLANT
WATER STERILE IRR 1000ML POUR (IV SOLUTION) ×2 IMPLANT

## 2011-11-29 NOTE — Anesthesia Procedure Notes (Signed)
Spinal  Patient location during procedure: OR Start time: 11/29/2011 11:59 AM End time: 11/29/2011 12:02 PM Staffing Anesthesiologist: Sandrea Hughs Performed by: anesthesiologist  Preanesthetic Checklist Completed: patient identified, site marked, surgical consent, pre-op evaluation, timeout performed, IV checked, risks and benefits discussed and monitors and equipment checked Spinal Block Patient position: sitting Prep: DuraPrep Patient monitoring: heart rate, cardiac monitor, continuous pulse ox and blood pressure Approach: midline Location: L3-4 Injection technique: single-shot Needle Needle type: Sprotte  Needle gauge: 24 G Needle length: 9 cm Needle insertion depth: 7 cm Assessment Sensory level: T6

## 2011-11-29 NOTE — Transfer of Care (Signed)
Immediate Anesthesia Transfer of Care Note  Patient: Monica Simmons  Procedure(s) Performed: Procedure(s) (LRB): CESAREAN SECTION (N/A)  Patient Location: PACU  Anesthesia Type: Spinal  Level of Consciousness: awake  Airway & Oxygen Therapy: Patient Spontanous Breathing  Post-op Assessment: Report given to PACU RN and Post -op Vital signs reviewed and stable  Post vital signs: stable  Complications: No apparent anesthesia complications

## 2011-11-29 NOTE — Brief Op Note (Signed)
11/29/2011  1:38 PM  PATIENT:  Monica Simmons  36 y.o. female  PRE-OPERATIVE DIAGNOSIS:  Previous Cesarean Section, Myomectomy  POST-OPERATIVE DIAGNOSIS:  Previous Cesarean Section, Myomectomy  PROCEDURE:  Procedure(s) (LRB): CESAREAN SECTION (N/A) RCS, 2 player closure, lysis of adhesions  SURGEON:  Surgeon(s) and Role:    * Suraj Ramdass A. Ernestina Penna, MD - Primary  PHYSICIAN ASSISTANT:   ASSISTANTS: Cousins, MD   ANESTHESIA:   spinal  EBL:  Total I/O In: 3200 [I.V.:3200] Out: 1000 [Urine:250; Blood:750]  BLOOD ADMINISTERED:none  DRAINS: Urinary Catheter (Foley)   LOCAL MEDICATIONS USED:  NONE  SPECIMEN:  Source of Specimen:  fibroid  DISPOSITION OF SPECIMEN:  PATHOLOGY  COUNTS:  YES  TOURNIQUET:  * No tourniquets in log *  DICTATION: .Note written in EPIC  PLAN OF CARE: Admit to inpatient   PATIENT DISPOSITION:  PACU - hemodynamically stable.   Delay start of Pharmacological VTE agent (>24hrs) due to surgical blood loss or risk of bleeding: yes

## 2011-11-29 NOTE — Op Note (Signed)
11/29/2011  1:38 PM  PATIENT:  Monica Simmons  36 y.o. female  PRE-OPERATIVE DIAGNOSIS:  Previous Cesarean Section, Myomectomy  POST-OPERATIVE DIAGNOSIS:  Previous Cesarean Section, Myomectomy  PROCEDURE:  Procedure(s) (LRB): CESAREAN SECTION (N/A) RCS, 2 player closure, lysis of adhesions  SURGEON:  Surgeon(s) and Role:    * Zarie Kosiba A. Ernestina Penna, MD - Primary  PHYSICIAN ASSISTANT:   ASSISTANTS: Cousins, MD   ANESTHESIA:   spinal  EBL:  Total I/O In: 3200 [I.V.:3200] Out: 1000 [Urine:250; Blood:750]  BLOOD ADMINISTERED:none  DRAINS: Urinary Catheter (Foley)   LOCAL MEDICATIONS USED:  NONE  SPECIMEN:  Source of Specimen:  fibroid  DISPOSITION OF SPECIMEN:  PATHOLOGY  COUNTS:  YES  TOURNIQUET:  * No tourniquets in log *  DICTATION: .Note written in EPIC  PLAN OF CARE: Admit to inpatient   PATIENT DISPOSITION:  PACU - hemodynamically stable.   Delay start of Pharmacological VTE agent (>24hrs) due to surgical blood loss or risk of bleeding: yes   Indications: 37+ weeks IUP, prior cesarean section, prior extensive myomectomy  Pre-operative Diagnosis: Previous Cesarean Section, Myomectomy   Post-operative Diagnosis: Previous Cesarean Section, Myomectomy, uterus adhesed to anterior abdominal wall  Procedure: CESAREAN SECTION, low transverse cesarean section, 2 player closure   Assistants: Cherly Hensen, M.D.  Anesthesia: Spinal  Anesthesiologist: Sandrea Hughs., MD    Surgeon: Noland Fordyce, MD  Findings:  @BABYSEXEBC @ infant,  APGAR (1 MIN): 9   APGAR (5 MINS): 9   APGAR (10 MINS):     EBL: Per anesthesia Antibiotics:  2 g Ancef  Complications: none  Indications: This is a 36 y.o. year-old, G3 P1 011  At [redacted]w[redacted]d admitted for repeat cesarean section. Given patient's prior uterine surgery and low-lying placenta patient understood risks of hemorrhage risk of scar tissue risks of possible need for hysterectomy due to abnormal placentation Risks  benefits and alternatives of the procedure were discussed with the patient who agreed to proceed  Procedure:  After informed consent was obtained the patient was taken to the operating room where spinal  anesthesia was initiated.  She was prepped and draped in the normal sterile fashion in dorsal supine position with a leftward tilt. Surgical tape was used to elevate her pannus cephalad.  A foley catheter was inserted sterilely into the bladder . Gloves were changes and attention was turned to the patient's abdomen. A Pfannenstiel skin incision was made 5 cm above the pubic symphysis in the midline with the scalpel over the prior Pfannenstiel incision.  Dissection was carried down with the Bovie cautery until the fascia was reached. The fascia was incised in the midline. The incision was extended laterally with the Mayo scissors. The inferior aspect of the fascial incision was grasped with the Coker clamps, elevated up and the underlying rectus muscles were dissected off sharply. The superior aspect of the fascial incision was grasped with the Coker clamps elevated up and the underlying rectus muscles were dissected off sharply.  The peritoneum was entered sharply. The rectus muscles were scarred together in the midline. Snaps were placed and used  to elevate the fascia and using combination of sharp dissection with the knife and the Metzenbaum scissors and the Bovie cautery, a small window was created into the peritoneum. During this dissection the myometrium was incised. After the small window was noted,a finger was placed into the pelvis and used to help identify the planes. The uterus was found to be densely adhered to the anterior abdominal wall this was taken down  sharply. The bladder was also densely adhered to the anterior abdominal wall and this was also taken down sharply. At least 15 minutes were spent in creating and dissecting these planes.  The bladder flap was created digitally. the bladder blade  was reinserted. Palpation was done to assess the fetal position and the location of the uterine vessels. The lower segment of the uterus was incised sharply with the scalpel and extended superiorly and laterally with the bandage scissors. The infant also was grasped though it was found to be flexed. The vertex  brought to the incision, a vacuum was placed on the vertex which was used to D. flexed the head, which was then delivered in flexion. The vacuum was released and the remainder of the infant  was delivered with fundal pressure.  The cord was clamped and cut. The infant was handed off to the waiting pediatrician. The placenta was expressed and despite the prior uterine surgery and low-lying position of the placenta the placenta came easily. The uterus was  unable to be exteriorized due to to dense adhesions throughout. The uterus was left in situ.  The uterus was cleared of all clots and debris. The uterine incision was repaired with 0 Vicryl in a running locked fashion.  A second layer of the same suture was used in an imbricating fashion to obtain excellent hemostasis.  evaluation of the lysis of adhesion revealed a weekend and bleeding area on the left upper anterior wall of the uterus several additional figure of 8 sutures and then a running 3-0 Vicryl suture was used to reapproximate this myometrium. Improved hemostasis was noted. the gutters were cleared of all clots and debris. The uterine incision was reinspected and found to be hemostatic.  a piece of Surgicel was used along the upper anterior uterus at the site of the lysis of adhesions. Interceed was then placed over this and all over the uterine incision. The  upper edge of the peritoneum was grasped and closed with 2-0 Vicryl in a running fashion.  the inferior aspect of the parent to me him was unable to be closed as it was densely adhered to the rectus muscles. The inferior rectus muscles were reapproximated with 2 sutures of 0 Vicryl .  The  fascia was closed with 0 Vicryl in two halves. The subcutaneous tissue was irrigated. Scarpa's layer was closed with a 2-0 plain gut suture. The skin was closed wit staples. The patient tolerated the procedure well. Sponge lap and needle counts were correct x3 and patient was taken to the recovery room in a stable condition.  Timouthy Gilardi A. 11/29/2011 1:40 PM

## 2011-11-29 NOTE — Anesthesia Postprocedure Evaluation (Signed)
Anesthesia Post Note  Patient: Monica Simmons  Procedure(s) Performed: Procedure(s) (LRB): CESAREAN SECTION (N/A)  Anesthesia type: Spinal  Patient location: PACU  Post pain: Pain level controlled  Post assessment: Post-op Vital signs reviewed  Last Vitals:  Filed Vitals:   11/29/11 1400  BP: 126/67  Pulse: 70  Temp:   Resp: 17    Post vital signs: Reviewed  Level of consciousness: awake  Complications: No apparent anesthesia complications

## 2011-11-29 NOTE — Consult Note (Signed)
Neonatology Note:   Attendance at C-section:    I was asked to attend this repeat C/S at 37 4/7 weeks due to previous myomectomy. The mother is a G3P1A1 O pos, GBS positive with anemia, chronic HTN, obesity, fibroids s/p myomectomy, low AFI, and fetal hydronephrosis which resolved on most recent fetal ultrasound. ROM at delivery, fluid clear. Vacuum-assisted delivery, very smooth extraction.  Infant vigorous with good spontaneous cry and tone. Needed nosuctioning. Ap 9/9. Lungs clear to ausc in DR. To CN to care of Pediatrician.   Deatra James, MD

## 2011-11-29 NOTE — Anesthesia Preprocedure Evaluation (Signed)
Anesthesia Evaluation  Patient identified by MRN, date of birth, ID band Patient awake    Reviewed: Allergy & Precautions, H&P , NPO status , Patient's Chart, lab work & pertinent test results  Airway Mallampati: III TM Distance: >3 FB Neck ROM: full    Dental No notable dental hx. (+) Teeth Intact   Pulmonary asthma ,  breath sounds clear to auscultation  Pulmonary exam normal       Cardiovascular + Valvular Problems/Murmurs MR Rhythm:regular Rate:Normal + Systolic murmurs    Neuro/Psych negative neurological ROS  negative psych ROS   GI/Hepatic negative GI ROS, Neg liver ROS, Controlled,  Endo/Other  Morbid obesity  Renal/GU negative Renal ROS  negative genitourinary   Musculoskeletal   Abdominal   Peds  Hematology negative hematology ROS (+)   Anesthesia Other Findings   Reproductive/Obstetrics (+) Pregnancy                           Anesthesia Physical Anesthesia Plan  ASA: II  Anesthesia Plan: Spinal   Post-op Pain Management:    Induction:   Airway Management Planned:   Additional Equipment:   Intra-op Plan:   Post-operative Plan:   Informed Consent: I have reviewed the patients History and Physical, chart, labs and discussed the procedure including the risks, benefits and alternatives for the proposed anesthesia with the patient or authorized representative who has indicated his/her understanding and acceptance.   Dental Advisory Given  Plan Discussed with: Anesthesiologist  Anesthesia Plan Comments:         Anesthesia Quick Evaluation

## 2011-11-29 NOTE — H&P (Signed)
Monica Simmons is a 36 y.o. G3P1011 at [redacted]w[redacted]d presenting for RCS. Pt notes no contractions. Good fetal movement, No vaginal bleeding, not leaking fluid. Early c/s for h/o significant myomectomy (24 myomas) and prior c/s. Will treat as classical c/s  [facility] at [facility] since 8 wks - dated by 8 wk u/s - prior significant myomectomy as above - persistent fibroids - complete previa until 30 wks, now R lateral placenta, 2.5 cm from os - risks for abnormal placentation. Per u/s w/ MFM at 34 wks, no evidence accreta - anemia, HGb at start of preg was 8, increased w/ iron, now 9.9 - fetal hydronephrosis b/l earlier in preg, resolved at last u/s - oligohydramnios during preg, AFI 0 in mid 30's, increased to 5-7 later in 3rd trimester - chronic htn, no prior dx but elevated bp's started at 10 wks, stayed <140/90  - obesity - GBS bacteruria - EIF, nl NT, nl AFP - growth u/s- periodic, stayed at 60%  OB History    Grav Para Term Preterm Abortions TAB SAB Ect Mult Living   3 1 1  0 1 1 0 0 0 1     Past Medical History  Diagnosis Date  . Anemia   . Heart murmur   . Asthma     no inhaler   Past Surgical History  Procedure Date  . Cesarean section   . Myomectomy    Family History: family history includes Cancer in her father and Hypertension in her mother. Social History:  reports that she has never smoked. She does not have any smokeless tobacco history on file. She reports that she does not drink alcohol or use illicit drugs.  Review of Systems - Negative     Blood pressure 135/83, pulse 84, temperature 97.7 F (36.5 C), temperature source Oral, resp. rate 16, last menstrual period 03/11/2011, SpO2 100.00%.  Physical Exam:  Gen: well appearing, no distress  Back: no CVAT Abd: gravid, NT, no RUQ pain LE: trace edema, equal bilaterally, non-tender Toco: not done FH: present  Prenatal labs: ABO, Rh: --/--/O POS (08/05 1022) Antibody: NEG (08/05 1022) Rubella:  immune RPR:  NON REACTIVE (07/29 1605)  HBsAg: Negative (01/14 1536)  HIV: Non-reactive (01/14 1536)  GBS:   pos 1 hr Glucola passsed  Genetic screening nl NT, nl AFP Anatomy US EIF, b/l pyelectasis  CBC    Component Value Date/Time   WBC 8.6 11/22/2011 1605   RBC 4.12 11/22/2011 1605   HGB 9.9* 11/22/2011 1605   HCT 32.3* 11/22/2011 1605   PLT 247 11/22/2011 1605   MCV 78.4 11/22/2011 1605   MCH 24.0* 11/22/2011 1605   MCHC 30.7 11/22/2011 1605   RDW 21.8* 11/22/2011 1605       Assessment/Plan: 36 y.o. G3P1011 at [redacted]w[redacted]d RCS given prior myomectomy - pt aware risks, esp w/ prior surgery and risks of abnl placentatation and hemorrhage. Blood crossmatched - declines TL - follow bp PP   Nolen Lindamood A. 11/29/2011, 11:47 AM

## 2011-11-30 ENCOUNTER — Encounter (HOSPITAL_COMMUNITY): Payer: Self-pay

## 2011-11-30 DIAGNOSIS — IMO0002 Reserved for concepts with insufficient information to code with codable children: Secondary | ICD-10-CM | POA: Diagnosis present

## 2011-11-30 HISTORY — DX: Reserved for concepts with insufficient information to code with codable children: IMO0002

## 2011-11-30 LAB — CBC
HCT: 29.6 % — ABNORMAL LOW (ref 36.0–46.0)
Hemoglobin: 9.2 g/dL — ABNORMAL LOW (ref 12.0–15.0)
MCV: 78.5 fL (ref 78.0–100.0)
Platelets: 233 10*3/uL (ref 150–400)
RBC: 3.77 MIL/uL — ABNORMAL LOW (ref 3.87–5.11)
WBC: 11.6 10*3/uL — ABNORMAL HIGH (ref 4.0–10.5)

## 2011-11-30 NOTE — Addendum Note (Signed)
Addendum  created 11/30/11 0758 by Suella Grove, CRNA   Modules edited:Notes Section

## 2011-11-30 NOTE — Progress Notes (Signed)
POD# 1  S: Pt notes pain controlled w/ po meds, minimal lochia, no void yet, foley just out, out of bed w/o dizziness or chest pain, tol reg po, + flatus. Pt is planning on breastfeeding  Filed Vitals:   11/29/11 2310 11/30/11 0110 11/30/11 0455 11/30/11 0855  BP: 134/88 121/78 127/76 116/68  Pulse: 90 76 70 70  Temp:  97.8 F (36.6 C) 97.9 F (36.6 C) 98.4 F (36.9 C)  TempSrc:  Oral Oral Oral  Resp: 18 16 16 16   Weight:      SpO2: 98% 98% 98% 99%    Gen: well appearing CV: RRR Pulm: CTAB Abd: soft, ND, approp tender, fundus below umbilicus, NT Inc: C/D/I, staples intact LE: tr edema, NT  CBC    Component Value Date/Time   WBC 11.6* 11/30/2011 0550   RBC 3.77* 11/30/2011 0550   HGB 9.2* 11/30/2011 0550   HCT 29.6* 11/30/2011 0550   PLT 233 11/30/2011 0550   MCV 78.5 11/30/2011 0550   MCH 24.4* 11/30/2011 0550   MCHC 31.1 11/30/2011 0550   RDW 21.2* 11/30/2011 0550    A/P: POD#  1 s/p RCS - post-op. Doing well - d/w pt no complications with c/s but scarring present and future c/s, gyn surgery will carry increased risk. - planning IUD PP - anemia. stable   Jamy Cleckler A. 11/30/2011 10:27 AM

## 2011-11-30 NOTE — Anesthesia Postprocedure Evaluation (Signed)
  Anesthesia Post-op Note  Patient: Monica Simmons  Procedure(s) Performed: Procedure(s) (LRB): CESAREAN SECTION (N/A)  Patient Location: Mother/Baby  Anesthesia Type: Spinal  Level of Consciousness: awake  Airway and Oxygen Therapy: Patient Spontanous Breathing  Post-op Pain: none  Post-op Assessment: Patient's Cardiovascular Status Stable, Respiratory Function Stable, Patent Airway, No signs of Nausea or vomiting, Adequate PO intake, Pain level controlled, No headache, No backache, No residual numbness and No residual motor weakness  Post-op Vital Signs: Reviewed and stable  Complications: No apparent anesthesia complications

## 2011-12-01 LAB — TYPE AND SCREEN
Antibody Screen: NEGATIVE
Unit division: 0

## 2011-12-01 NOTE — Progress Notes (Addendum)
Patient ID: Monica Simmons, female   DOB: 1976-04-04, 36 y.o.   MRN: [id number] POD # 2  Subjective: Pt reports feeling well/ Pain controlled with ibuprofen and percocet Tolerating po/Voiding without problems/ No n/v/Flatus pos and has had bowel movement Activity: out of bed and ambulate Bleeding is light Newborn info:  Information for the patient's newborn:  Alonia, Dibuono [[id number]]  female Feeding: bottle   Objective: VS: Blood pressure 128/84, pulse 80, temperature 98.2 F (36.8 C), temperature source Oral, resp. rate 18.    Physical Exam:  General: alert, cooperative and no distress CV: Regular rate and rhythm Resp: clear Abdomen: soft, nontender, normal bowel sounds Uterine Fundus: firm, below umbilicus, nontender Incision: well approximated with staples; mod amt clear to brown fluid noted in panus fold; small amt thin brown fluid expressed from left aspect of incision; no swelling or redness; NT.  Will monitor Perineum: is normal Lochia: minimal Ext: edema +1 to +2 and Homans sign is negative, no sign of DVT    A/P: POD # 2/ G3P2012/ S/P C/Section d/t planned repeat Small seroma to incision; will monitor and review with MD. Doing well Continue routine post op orders Anticipate discharge home in the am   Signed: Demetrius Revel, MSN, [facility] 12/01/2011, 10:03 AM  MD note- Incision evaluates, copious serous/brown watery drainage from left side of incision. Staples in place.  No cellulitis or foul odor. Advised to allow further drainage lie on left lateral, RN to intermittently apply pressure to help drain more. Will d/w Dr Algie Coffer, may consider removing 1-2 staples to aid drainage and pack to keep wound dry. Pt agrees.  --V.Juliene Pina, MD

## 2011-12-02 MED ORDER — OXYCODONE-ACETAMINOPHEN 5-325 MG PO TABS: 1.0000 | ORAL_TABLET | ORAL | Status: AC | PRN

## 2011-12-02 MED ORDER — IBUPROFEN 600 MG PO TABS: 600.0000 mg | ORAL_TABLET | Freq: Four times a day (QID) | ORAL | Status: AC

## 2011-12-02 NOTE — Discharge Summary (Signed)
POSTOPERATIVE DISCHARGE SUMMARY:  Patient ID: Monica Simmons MRN: [id number] DOB/AGE: Mar 30, 1976 36 y.o.  Admit date: 11/29/2011 Discharge date:  12/02/2011  Admission Diagnoses: 1. Term pregnancy 2. Previous cesarean section and previous myomectomy  Discharge Diagnoses:   Term Pregnancy-delivered and post-operative day 3 status stable  Prenatal history: [id number]   EDC : 12/16/2011, by Last Menstrual Period  [facility] at [facility] since 8 wks , primary Dr. Ernestina Penna - dated by 8 wk u/s  - prior significant myomectomy as above  - persistent fibroids  - complete previa until 30 wks, now R lateral placenta, 2.5 cm from os  - risks for abnormal placentation. Per u/s w/ MFM at 34 wks, no evidence accreta  - anemia, HGb at start of preg was 8, increased w/ iron, now 9.9  - fetal hydronephrosis b/l earlier in preg, resolved at last u/s  - oligohydramnios during preg, AFI 0 in mid 30's, increased to 5-7 later in 3rd trimester  - chronic htn, no prior dx but elevated bp's started at 10 wks, stayed <140/90  - obesity  - GBS bacteruria  - EIF, nl NT, nl AFP  - growth u/s- periodic, stayed at 60%   Prenatal Labs: ABO, Rh: --/--/O POS (08/05 1022)  Antibody: NEG (08/05 1022)  Rubella: immune  RPR: NON REACTIVE (07/29 1605)  HBsAg: Negative (01/14 1536)  HIV: Non-reactive (01/14 1536)  GBS: pos  1 hr Glucola passsed  Genetic screening nl NT, nl AFP  Anatomy US EIF, b/l pyelectasis   Medical / Surgical History :  Past medical history:  Past Medical History  Diagnosis Date  . Anemia   . Heart murmur   . Asthma     no inhaler  . Postpartum care following cesarean delivery (8/5) 11/30/2011  . Maternal anemia complicating pregnancy, childbirth, or the puerperium 11/30/2011    Past surgical history:  Past Surgical History  Procedure Date  . Cesarean section   . Myomectomy   . Cesarean section 11/29/2011    Procedure: CESAREAN SECTION;  Surgeon: Tresa Endo A. Ernestina Penna, MD;  Location:  [facility];  Service: Gynecology;  Laterality: N/A;  Requests 75 min.  EDD: 12/16/11    Family History:  Family History  Problem Relation Age of Onset  . Hypertension Mother   . Cancer Father     prostate    Social History:  reports that she has never smoked. She does not have any smokeless tobacco history on file. She reports that she does not drink alcohol or use illicit drugs.   Allergies: Other    Current Medications at time of admission:  Prescriptions prior to admission  Medication Sig Dispense Refill  . Fe Cbn-Fe Gluc-FA-B12-C-DSS (FERRALET 90) 90-1 MG TABS Take 2 tablets by mouth daily.      . Hydrocortisone Butyrate 0.1 % OINT Apply 1 application topically as needed. For eczema      . Prenatal Vit-Fe Fumarate-FA (PRENATAL MULTIVITAMIN) TABS Take 1 tablet by mouth daily.      Marland Kitchen terconazole (TERAZOL 7) 0.4 % vaginal cream Place 1 applicator vaginally as needed. For vaginitis         Admit labs:  11/22/2011 16:05  WBC 8.6  RBC 4.12  Hemoglobin 9.9 (L)  HCT 32.3 (L)  MCV 78.4  MCH 24.0 (L)  MCHC 30.7  RDW 21.8 (H)  Platelets 247     Intrapartum Course: n/a  Procedures: Cesarean section delivery of female newborn by Dr Ernestina Penna  See operative report for further details  Postoperative / postpartum course:  Patient recovered well postoperatively. Her incision was noted to drain copious amounts of light brown fluid without odor and without signs of selulitis or infection. @ staples were removed from left incision edge and packing with wick placed to facilitate drainage.  Physical Exam:  VSS: Blood pressure 146/89, pulse 88, temperature 97.8 F (36.6 C), temperature source Oral, resp. rate 20, weight 103.874 kg (229 lb), last menstrual period 03/11/2011, SpO2 98.00%, unknown if currently breastfeeding.   LABS:  Basename 11/30/11 0550  WBC 11.6*  HGB 9.2*  HCT 29.6*  PLT 233     Incision:  approximated with staples / no erythema / no ecchymosis  Staples: remained in  place for removal in one week post-op.  Discharge Instructions:  Discharged Condition: good Activity: pelvic rest, weight lifting and driving restrictions x 2 weeks Diet: routine Medications:  Medication List  As of 12/02/2011 11:15 AM   TAKE these medications         Ferralet 90 90-1 MG Tabs   Take 2 tablets by mouth daily.      Hydrocortisone Butyrate 0.1 % Oint   Apply 1 application topically as needed. For eczema      ibuprofen 600 MG tablet   Commonly known as: ADVIL,MOTRIN   Take 1 tablet (600 mg total) by mouth every 6 (six) hours.      oxyCODONE-acetaminophen 5-325 MG per tablet   Commonly known as: PERCOCET/ROXICET   Take 1-2 tablets by mouth every 4 (four) hours as needed (moderate - severe pain).      prenatal multivitamin Tabs   Take 1 tablet by mouth daily.      terconazole 0.4 % vaginal cream   Commonly known as: TERAZOL 7   Place 1 applicator vaginally as needed. For vaginitis           Condition: stable Postpartum Instructions: refer to practice specific booklet Discharge to: home Disposition:  Follow up :  Follow-up Information    Follow up with [facility] A[facility] MD. Schedule an appointment as soon as possible for a visit in 6 weeks.   Contact information:   [address] [place] [address] [phone]       Follow up with [facility]. Schedule an appointment as soon as possible for a visit in 1 day. (for incision check)           Signed: Navea Woodrow 12/02/2011, 11:15 AM

## 2011-12-02 NOTE — Progress Notes (Signed)
Subjective: POD# 3 Information for the patient's newborn:  Monica, Simmons [[id number]]  female  / circ done  Reports feeling well. Feeding: bottle and bottle Patient reports tolerating PO.  Breast symptoms: none Pain controlled with Motrin and Percocet Denies HA/SOB/C/P/N/V/dizziness. Flatus present. She reports vaginal bleeding as normal, without clots.  She is ambulating, urinating without difficult.     Objective:   VS:  Filed Vitals:   12/01/11 0553 12/01/11 1544 12/01/11 2257 12/02/11 0630  BP: 128/84 125/82 136/67 146/89  Pulse: 80 82 80 88  Temp: 98.2 F (36.8 C) 98.1 F (36.7 C) 98.2 F (36.8 C) 97.8 F (36.6 C)  TempSrc: Oral Oral Oral Oral  Resp: 18 18 18 20   Weight:      SpO2:        No intake or output data in the 24 hours ending 12/02/11 1111      Basename 11/30/11 0550  WBC 11.6*  HGB 9.2*  HCT 29.6*  PLT 233     Blood type: --/--/O POS (08/05 1022)  Rubella: Immune (01/14 1536)     Physical Exam:  General: alert, cooperative, no distress and morbidly obese CV: Regular rate and rhythm Resp: clear Abdomen: soft, nontender, normal bowel sounds, +panus Incision: clean, intact and light brown moderate amount from L incision edge. + edema around incision line drainage present 2 staples removed and packing with sterile tape and wick to aid fluid drainage Uterine Fundus: firm, below umbilicus, nontender Lochia: minimal Ext: edema +2 pedal and in panus and Homans sign is negative, no sign of DVT      Assessment/Plan: 35 y.o.   POD# 3.  s/p Cesarean Delivery.  Indications: repeat scheduled                Principal Problem:  *Postpartum care following cesarean delivery (8/5) Active Problems:  Maternal anemia complicating pregnancy, childbirth, or the puerperium  Oral iron supplement resumed Doing well, stable.    Staples removed and packing in place F/U in office tomorrow to change packing and reassess wound. No odor and no s/s cellulitis  present Routine post-op care DC home with WOB precautions  Monica Simmons 12/02/2011, 11:11 AM

## 2011-12-02 NOTE — Plan of Care (Signed)
Problem: Discharge Progression Outcomes Goal: Remove staples per MD order Office removal

## 2011-12-06 ENCOUNTER — Encounter (HOSPITAL_COMMUNITY): Payer: Self-pay | Admitting: *Deleted

## 2011-12-06 ENCOUNTER — Observation Stay (HOSPITAL_COMMUNITY)
Admission: AD | Admit: 2011-12-06 | Discharge: 2011-12-07 | Disposition: A | Discharge status: Home / Self Care | Payer: BC Managed Care – PPO | Source: Ambulatory Visit | Attending: Obstetrics | Admitting: Obstetrics

## 2011-12-06 DIAGNOSIS — Q838 Other congenital malformations of breast: Secondary | ICD-10-CM

## 2011-12-06 DIAGNOSIS — IMO0001 Reserved for inherently not codable concepts without codable children: Principal | ICD-10-CM | POA: Insufficient documentation

## 2011-12-06 DIAGNOSIS — O909 Complication of the puerperium, unspecified: Secondary | ICD-10-CM | POA: Insufficient documentation

## 2011-12-06 DIAGNOSIS — Z0000 Encounter for general adult medical examination without abnormal findings: Secondary | ICD-10-CM

## 2011-12-06 LAB — URIC ACID: Uric Acid, Serum: 5.2 mg/dL (ref 2.4–7.0)

## 2011-12-06 LAB — CBC
HCT: 38 % (ref 36.0–46.0)
Hemoglobin: 11.5 g/dL — ABNORMAL LOW (ref 12.0–15.0)
MCH: 23.9 pg — ABNORMAL LOW (ref 26.0–34.0)
MCHC: 30.3 g/dL (ref 30.0–36.0)
MCV: 79 fL (ref 78.0–100.0)
Platelets: 405 10*3/uL — ABNORMAL HIGH (ref 150–400)
RBC: 4.81 MIL/uL (ref 3.87–5.11)
RDW: 20.9 % — ABNORMAL HIGH (ref 11.5–15.5)
WBC: 11.3 10*3/uL — ABNORMAL HIGH (ref 4.0–10.5)

## 2011-12-06 LAB — COMPREHENSIVE METABOLIC PANEL
ALT: 62 U/L — ABNORMAL HIGH (ref 0–35)
AST: 31 U/L (ref 0–37)
Albumin: 3 g/dL — ABNORMAL LOW (ref 3.5–5.2)
Alkaline Phosphatase: 134 U/L — ABNORMAL HIGH (ref 39–117)
BUN: 7 mg/dL (ref 6–23)
CO2: 29 mEq/L (ref 19–32)
Calcium: 8.9 mg/dL (ref 8.4–10.5)
Chloride: 103 mEq/L (ref 96–112)
Creatinine, Ser: 0.46 mg/dL — ABNORMAL LOW (ref 0.50–1.10)
GFR calc Af Amer: >90 mL/min (ref >90)
GFR calc non Af Amer: >90 mL/min (ref >90)
Glucose, Bld: 85 mg/dL (ref 70–99)
Potassium: 3.6 mEq/L (ref 3.5–5.1)
Sodium: 140 mEq/L (ref 135–145)
Total Bilirubin: 0.6 mg/dL (ref 0.3–1.2)
Total Protein: 6.7 g/dL (ref 6.0–8.3)

## 2011-12-06 MED ORDER — LABETALOL HCL 100 MG PO TABS
100.0000 mg | ORAL_TABLET | Freq: Once | ORAL | Status: DC
  Filled 2011-12-06: qty 1

## 2011-12-06 MED ORDER — SODIUM CHLORIDE 0.9 % IJ SOLN: 3.0000 mL | INTRAMUSCULAR | Status: DC | PRN

## 2011-12-06 MED ORDER — DOCUSATE SODIUM 100 MG PO CAPS: 100.0000 mg | ORAL_CAPSULE | Freq: Two times a day (BID) | ORAL | Status: DC

## 2011-12-06 MED ORDER — ONDANSETRON HCL 4 MG PO TABS: 4.0000 mg | ORAL_TABLET | Freq: Four times a day (QID) | ORAL | Status: DC | PRN

## 2011-12-06 MED ORDER — LABETALOL HCL 5 MG/ML IV SOLN
20.0000 mg | Freq: Once | INTRAVENOUS | Status: AC
  Administered 2011-12-06: 20 mg via INTRAVENOUS
  Filled 2011-12-06: qty 4

## 2011-12-06 MED ORDER — ONDANSETRON HCL 4 MG/2ML IJ SOLN: 4.0000 mg | Freq: Four times a day (QID) | INTRAMUSCULAR | Status: DC | PRN

## 2011-12-06 MED ORDER — OXYCODONE-ACETAMINOPHEN 5-325 MG PO TABS
1.0000 | ORAL_TABLET | ORAL | Status: DC | PRN
  Administered 2011-12-06: 2 via ORAL
  Filled 2011-12-06: qty 2

## 2011-12-06 MED ORDER — LABETALOL HCL 100 MG PO TABS
100.0000 mg | ORAL_TABLET | Freq: Once | ORAL | Status: AC
  Administered 2011-12-06: 100 mg via ORAL

## 2011-12-06 MED ORDER — GUAIFENESIN 100 MG/5ML PO SOLN: 15.0000 mL | ORAL | Status: DC | PRN

## 2011-12-06 MED ORDER — LABETALOL HCL 5 MG/ML IV SOLN
10.0000 mg | Freq: Once | INTRAVENOUS | Status: AC
  Administered 2011-12-06: 10 mg via INTRAVENOUS
  Filled 2011-12-06: qty 4

## 2011-12-06 MED ORDER — SODIUM CHLORIDE 0.9 % IJ SOLN: 3.0000 mL | Freq: Two times a day (BID) | INTRAMUSCULAR | Status: DC

## 2011-12-06 MED ORDER — MENTHOL 3 MG MT LOZG: 1.0000 | LOZENGE | OROMUCOSAL | Status: DC | PRN

## 2011-12-06 MED ORDER — SODIUM CHLORIDE 0.9 % IV SOLN: 250.0000 mL | INTRAVENOUS | Status: DC | PRN

## 2011-12-06 MED ORDER — HYDRALAZINE HCL 20 MG/ML IJ SOLN
5.0000 mg | Freq: Once | INTRAMUSCULAR | Status: AC
  Administered 2011-12-06: 5 mg via INTRAVENOUS
  Filled 2011-12-06: qty 1

## 2011-12-06 MED ORDER — LABETALOL HCL 5 MG/ML IV SOLN
INTRAVENOUS | Status: AC
  Filled 2011-12-06: qty 4

## 2011-12-06 MED ORDER — LABETALOL HCL 100 MG PO TABS
100.0000 mg | ORAL_TABLET | Freq: Once | ORAL | Status: AC
  Administered 2011-12-06: 100 mg via ORAL
  Filled 2011-12-06: qty 1

## 2011-12-06 MED ORDER — IBUPROFEN 600 MG PO TABS
600.0000 mg | ORAL_TABLET | Freq: Four times a day (QID) | ORAL | Status: DC | PRN
  Administered 2011-12-07: 600 mg via ORAL
  Filled 2011-12-06 (×2): qty 1

## 2011-12-06 MED ORDER — LABETALOL HCL 5 MG/ML IV SOLN
40.0000 mg | Freq: Once | INTRAVENOUS | Status: AC
  Administered 2011-12-06: 40 mg via INTRAVENOUS

## 2011-12-06 MED ORDER — LABETALOL HCL 100 MG PO TABS
200.0000 mg | ORAL_TABLET | Freq: Three times a day (TID) | ORAL | Status: DC
  Administered 2011-12-07 (×3): 200 mg via ORAL
  Filled 2011-12-06 (×5): qty 2

## 2011-12-06 NOTE — MAU Note (Signed)
Pt presents for complaints of high blood pressure, states she delivered and went home on Thursday 12/02/11.

## 2011-12-06 NOTE — H&P (Signed)
CC: htn  HPI: 36 yo G3P2012 7 days post-op from [facility] who presented to office for incision check and found to be hypertensive. Pt notes HA yesterday that resolved with Motrin. No HA today, no vision change, no RUQ pain, no N/V. Pt w/o a documented history of htn, though elevated bp's noted prior to [redacted] wks gestation. bp's stayes <140/90 and given that pt likely had undiagnosed chronic htn, meds were not started. Fetal growth was followed and pt never exhibited any signs, symptoms or lab evidence of PEC. Pt did have elevated bps in office late last wk when seen for staple removal and was prescribed HCTZ for both the htn and the edema. Pt did not take the prescribed meds. She does note diuresis on her own since last week.   Pt also with wound disruption on L half of incision, this is currently being treated with wet to dry packing.   Medical history: [facility] at [facility] since 8 wks  - dated by 8 wk u/s  - prior significant myomectomy(24 myomas, open myomectomy - persistent fibroids  - complete previa until 30 wks, now R lateral placenta, 2.5 cm from os  - risks for abnormal placentation. Per u/s w/ MFM at 34 wks, no evidence accreta  - anemia, HGb at start of preg was 8, increased w/ iron, now 9.9  - fetal hydronephrosis b/l earlier in preg, resolved at last u/s  - oligohydramnios during preg, AFI 0 in mid 30's, increased to 5-7 later in 3rd trimester  - chronic htn, no prior dx but elevated bp's started at 10 wks, stayed <140/90  - obesity  - GBS bacteruria  - EIF, nl NT, nl AFP  - growth u/s- periodic, stayed at 60%   POb  POBHx: c/s x 2  Past Medical History   Diagnosis  Date   .  Anemia    .  Heart murmur    .  Asthma      no inhaler    Past Surgical History   Procedure  Date   .  Cesarean section    .  Myomectomy     Family History: family history includes Cancer in her father and Hypertension in her mother.  Social History: reports that she has never smoked. She does not  have any smokeless tobacco history on file. She reports that she does not drink alcohol or use illicit drugs.   PE:  Filed Vitals:   12/06/11 1616 12/06/11 1659 12/06/11 1704 12/06/11 1708  BP: 174/91 150/109 184/99 168/97  Pulse: 71 71 71 73   Gen: well appearing, no distress CV: RRR Pulm: CTAB Abd: obese, approp tender, unable to palpate fundus (low in obese abdomen), no fundal tenderness Inc: Healing by secondary intention in L half, no evidence cellulitis, good granulation tissue, r half well approximated LE: no clonus, 2+ DTR, 2+ b/l LE edema, non-tender, no tension to edema (in contrast to exam last week)  CBC    Component Value Date/Time   WBC 11.3* 12/06/2011 1610   RBC 4.81 12/06/2011 1610   HGB 11.5* 12/06/2011 1610   HCT 38.0 12/06/2011 1610   PLT 405* 12/06/2011 1610   MCV 79.0 12/06/2011 1610   MCH 23.9* 12/06/2011 1610   MCHC 30.3 12/06/2011 1610   RDW 20.9* 12/06/2011 1610    CMP     Component Value Date/Time   NA 140 12/06/2011 1610   K 3.6 12/06/2011 1610   CL 103 12/06/2011 1610   CO2 29  12/06/2011 1610   GLUCOSE 85 12/06/2011 1610   BUN 7 12/06/2011 1610   CREATININE 0.46* 12/06/2011 1610   CALCIUM 8.9 12/06/2011 1610   PROT 6.7 12/06/2011 1610   ALBUMIN 3.0* 12/06/2011 1610   AST 31 12/06/2011 1610   ALT 62* 12/06/2011 1610   ALKPHOS 134* 12/06/2011 1610   BILITOT 0.6 12/06/2011 1610   GFRNONAA >90 12/06/2011 1610   GFRAA >90 12/06/2011 1610    Triage course: I was delayed in seeing pt due to obstetric emergency, however pt remained in ER w/o symptoms. Bp elevation noted. Pt received 100mg  po labetalol and 10 mg IV labetalol. diastolics still remained in mid 90's.   A/P: PP htn likely on top of chronic htn. Cannot exclude pre-eclampsia. Given elevation in AST along w/ bp and slow response to meds, will admit for overnight observation. Will increase to 200mg  labetalol orally q 8hrs, repeat PEC labs in am and follow bp closely.   Cathyann Kilfoyle A. 12/06/2011 7:06 PM

## 2011-12-07 LAB — DIFFERENTIAL
Basophils Absolute: 0 10*3/uL (ref 0.0–0.1)
Basophils Relative: 0 % (ref 0–1)
Eosinophils Absolute: 0.3 10*3/uL (ref 0.0–0.7)
Eosinophils Relative: 3 % (ref 0–5)

## 2011-12-07 LAB — COMPREHENSIVE METABOLIC PANEL
AST: 24 U/L (ref 0–37)
Albumin: 2.8 g/dL — ABNORMAL LOW (ref 3.5–5.2)
Alkaline Phosphatase: 114 U/L (ref 39–117)
BUN: 8 mg/dL (ref 6–23)
CO2: 26 mEq/L (ref 19–32)
Chloride: 106 mEq/L (ref 96–112)
Potassium: 3 mEq/L — ABNORMAL LOW (ref 3.5–5.1)
Total Bilirubin: 0.5 mg/dL (ref 0.3–1.2)

## 2011-12-07 LAB — CBC
MCH: 24 pg — ABNORMAL LOW (ref 26.0–34.0)
MCV: 78.7 fL (ref 78.0–100.0)
Platelets: 380 10*3/uL (ref 150–400)
RDW: 20.9 % — ABNORMAL HIGH (ref 11.5–15.5)

## 2011-12-07 MED ORDER — NIFEDIPINE 10 MG PO CAPS: 10.0000 mg | ORAL_CAPSULE | Freq: Three times a day (TID) | ORAL | Status: DC

## 2011-12-07 MED ORDER — NIFEDIPINE ER 30 MG PO TB24
30.0000 mg | ORAL_TABLET | Freq: Every day | ORAL | Status: DC
  Administered 2011-12-07: 30 mg via ORAL
  Filled 2011-12-07 (×2): qty 1

## 2011-12-07 MED ORDER — HYDRALAZINE HCL 20 MG/ML IJ SOLN
INTRAMUSCULAR | Status: AC
  Filled 2011-12-07: qty 1

## 2011-12-07 MED ORDER — HYDRALAZINE HCL 20 MG/ML IJ SOLN
5.0000 mg | Freq: Once | INTRAMUSCULAR | Status: AC
  Administered 2011-12-07: 5 mg via INTRAVENOUS

## 2011-12-07 NOTE — Progress Notes (Signed)
S: pt notes slight HA, frontal only, for short period last night, resolved with Motrin, No HA currently. No vision change, no SOB, no CP, no scotomata. Tol reg po, no RUQ pain. Pt notes still with good voiding. Breastfeeding and baby well.  Course: In ED pt received IV labetalol 10 mg, then po labetalol 100mg  then 20mg  IV then another 100mg  oral. Given persistant htn pt then had 40mg  IV labetalol. Still w/ diastolics in the 100's and pt had good response to IV hydralizine 5mg . Overnight pt's bps improved 150s/80's until early this am when diastolics again in the 100s. Responded to a second dose of hydralizine  PE: Filed Vitals:   12/07/11 0630  BP: 167/100  Pulse: 77  Temp: 98 F (36.7 C)  Resp: 18    Gen: well appearing CV: RRR Pulm: CTAB Abd: obese, soft, NT, no RUQ pain Inc: good granulation tissue, no evidence cellulitis LE: 2+ edema, no clonus, 2+ DTR  Labs noted- Cr low, improvement in AST, hgb stable (likely hemoconcentrated yest)  A/P: 8 days post-op from [facility], here with labile bps, though improvement in PEC labs and normal exam - attempting to transition to oral antihypertensives, Pt w/ likely chronic htn as bp elevations early in preg, though given her bps' remaine <140/90, was never started on meds. Pt has had suboptimal response to oral and IV labetolol and unable to increase due to pulse. Will start procardia 10 mg, If pt's bp remain stable on 10 mg procardia and 200 mg labetalol will be able to d/c pt on procardia 30XL daily and labetalol 200 tid. Will f/u later this week and cont meds until 6 wks PP at which time if bp's remain elevated will need internist to manage.  - wound packing bid  Madilyn Cephas A. 12/07/2011 8:50 AM

## 2011-12-07 NOTE — Progress Notes (Signed)
Dr Ernestina Penna notified of blood pressures.  Pt requests discharge.  Orders received to discharge patient.

## 2011-12-07 NOTE — Progress Notes (Signed)
Discharge instructions reviewed with patient.  Procardia and Labetalol sent to patients pharmacy by the office nurse, patient aware.

## 2011-12-20 NOTE — Progress Notes (Signed)
Post discharge review completed. 

## 2011-12-23 NOTE — Discharge Summary (Signed)
CC: pt admitted for PP hypertension  HPI: 36 yo G3P2012 7 days post-op from [facility] who presented to office for incision check and found to be hypertensive. Pt notes HA yesterday that resolved with Motrin. No HA today, no vision change, no RUQ pain, no N/V. Pt w/o a documented history of htn, though elevated bp's noted prior to [redacted] wks gestation. bp's stayes <140/90 and given that pt likely had undiagnosed chronic htn, meds were not started. Fetal growth was followed and pt never exhibited any signs, symptoms or lab evidence of PEC. Pt did have elevated bps in office late last wk when seen for staple removal and was prescribed HCTZ for both the htn and the edema. Pt did not take the prescribed meds. She does note diuresis on her own since last week.  Pt also with wound disruption on L half of incision, this is currently being treated with wet to dry packing.    Hosp Course: Pt was admitted after needing several doses of IV and po labetalol. She also needed IV hydralazine. Bp's improved for much of the note and the pt remained asymptomatic. However, early in the am on HD#2 bp's rose again and pt needed a second dose of IV hydralizine. She was also started on po procardia in addition to the labetolol. PEC labs continued to be stable and pt remained asymptomatic.  D/c disposition: to home D/c diagnosis: wound disruption, PP hypertension D/c condition: stable D/c meds: labetalol, procardia, motrin, percocet D/c f/u: in office next week.  Torie Towle A. 12/23/2011 9:51 PM

## 2012-02-02 ENCOUNTER — Encounter: Payer: Self-pay | Admitting: *Deleted

## 2012-03-21 ENCOUNTER — Encounter: Payer: BC Managed Care – PPO | Admitting: Family Medicine

## 2012-05-17 ENCOUNTER — Ambulatory Visit (INDEPENDENT_AMBULATORY_CARE_PROVIDER_SITE_OTHER): Payer: BC Managed Care – PPO | Admitting: Family Medicine

## 2012-05-17 DIAGNOSIS — Z0000 Encounter for general adult medical examination without abnormal findings: Secondary | ICD-10-CM

## 2012-05-17 NOTE — Progress Notes (Signed)
Cancelled.  

## 2012-05-23 ENCOUNTER — Ambulatory Visit (INDEPENDENT_AMBULATORY_CARE_PROVIDER_SITE_OTHER): Payer: BC Managed Care – PPO | Admitting: Family Medicine

## 2012-05-23 ENCOUNTER — Encounter: Payer: Self-pay | Admitting: Family Medicine

## 2012-05-23 VITALS — BP 130/100 | HR 84 | Temp 97.9°F | Ht 61.0 in | Wt 212.0 lb

## 2012-05-23 DIAGNOSIS — B359 Dermatophytosis, unspecified: Secondary | ICD-10-CM

## 2012-05-23 DIAGNOSIS — Z0000 Encounter for general adult medical examination without abnormal findings: Secondary | ICD-10-CM

## 2012-05-23 DIAGNOSIS — Z136 Encounter for screening for cardiovascular disorders: Secondary | ICD-10-CM

## 2012-05-23 DIAGNOSIS — D649 Anemia, unspecified: Secondary | ICD-10-CM

## 2012-05-23 DIAGNOSIS — I10 Essential (primary) hypertension: Secondary | ICD-10-CM

## 2012-05-23 LAB — CBC WITH DIFFERENTIAL/PLATELET
Basophils Relative: 0.4 % (ref 0.0–3.0)
Eosinophils Relative: 2.2 % (ref 0.0–5.0)
Lymphocytes Relative: 30.3 % (ref 12.0–46.0)
Monocytes Relative: 8.6 % (ref 3.0–12.0)
Neutrophils Relative %: 58.5 % (ref 43.0–77.0)
RBC: 4.96 Mil/uL (ref 3.87–5.11)
WBC: 7.5 10*3/uL (ref 4.5–10.5)

## 2012-05-23 LAB — LIPID PANEL
LDL Cholesterol: 87 mg/dL (ref 0–99)
Total CHOL/HDL Ratio: 4
VLDL: 9.6 mg/dL (ref 0.0–40.0)

## 2012-05-23 LAB — COMPREHENSIVE METABOLIC PANEL
ALT: 21 U/L (ref 0–35)
Alkaline Phosphatase: 95 U/L (ref 39–117)
CO2: 27 mEq/L (ref 19–32)
GFR: 141.31 mL/min (ref >60.00)
Sodium: 139 mEq/L (ref 135–145)
Total Bilirubin: 1.6 mg/dL — ABNORMAL HIGH (ref 0.3–1.2)
Total Protein: 7.9 g/dL (ref 6.0–8.3)

## 2012-05-23 MED ORDER — KETOCONAZOLE 2 % EX CREA: TOPICAL_CREAM | Freq: Every day | CUTANEOUS | Status: DC

## 2012-05-23 MED ORDER — HYDROCHLOROTHIAZIDE 12.5 MG PO CAPS: 12.5000 mg | ORAL_CAPSULE | Freq: Every day | ORAL | Status: DC

## 2012-05-23 NOTE — Patient Instructions (Addendum)
We starting HCTZ 12.5 mg daily. We are checking some labs today- I will call you with those results.  Please use Nizoral Cream on your lesions.  Follow up with me in 2 weeks.  Congratulations on your new son!

## 2012-05-23 NOTE — Progress Notes (Signed)
Subjective:    Patient ID: Monica Simmons, female    DOB: 1976-01-03, 37 y.o.   MRN: [id number]  HPI  37 yo very pleasant G2P2 originally here for CPX but wanted to discuss several issues instead.  Just had her second child 5 months ago via C- section. Doing well but pregnancy was complicated by PIH.  States she was "on something for high blood pressure for pregnant women" but is not taking anything currently. Never had any issues with HTN in past. No HA or blurred vision but can tell BP has been elevated. BP Readings from Last 3 Encounters:  05/23/12 130/100  12/07/11 114/69  12/02/11 146/89     Anemia- was on iron during her pregnancy.  Not taking anything now.  Does feel a little fatigued but the baby is not sleeping well. 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  Not responding to OTC lotrimin like they usually do.   Patient Active Problem List  Diagnosis  . Routine general medical examination at a health care facility  . Ectopic breast tissue   Past Medical History  Diagnosis Date  . Anemia   . Heart murmur   . Asthma     no inhaler  . Postpartum care following cesarean delivery (8/5) 11/30/2011  . Maternal anemia complicating pregnancy, childbirth, or the puerperium 11/30/2011   Past Surgical History  Procedure Date  . Cesarean section   . Myomectomy   . Cesarean section 11/29/2011    Procedure: CESAREAN SECTION;  Surgeon: Tresa Endo A. Ernestina Penna, MD;  Location: [facility];  Service: Gynecology;  Laterality: N/A;  Requests 75 min.  EDD: 12/16/11   History  Substance Use Topics  . Smoking status: Never Smoker   . Smokeless tobacco: Not on file  . Alcohol Use: No   Family History  Problem Relation Age of Onset  . Hypertension Mother   . Cancer Father     prostate   Allergies  Allergen Reactions  . Other Hives    Powdered latex  causes hives    Current Outpatient Prescriptions on File Prior to Visit  Medication Sig Dispense Refill  . Fe Cbn-Fe Gluc-FA-B12-C-DSS (FERRALET 90) 90-1 MG TABS Take 2 tablets by mouth daily.      . Hydrocortisone Butyrate 0.1 % OINT Apply 1 application topically as needed. For eczema      . Prenatal Vit-Fe Fumarate-FA (PRENATAL MULTIVITAMIN) TABS Take 1 tablet by mouth daily.      Marland Kitchen terconazole (TERAZOL 7) 0.4 % vaginal cream Place 1 applicator vaginally as needed. For vaginitis       The PMH, PSH, Social History, Family History, Medications, and allergies have been reviewed in [facility], and have been updated if relevant.  Review of Systems See HPI Patient reports no  vision/ hearing changes,anorexia, weight change, fever ,adenopathy, persistant / recurrent hoarseness, swallowing issues, chest pain, edema,persistant / recurrent cough, hemoptysis, dyspnea(rest, exertional, paroxysmal nocturnal), gastrointestinal  bleeding (melena, rectal bleeding), abdominal pain, excessive heart burn, GU symptoms(dysuria, hematuria, pyuria, voiding/incontinence  Issues) syncope, focal weakness, severe memory loss, concerning skin lesions, depression, anxiety, abnormal bruising/bleeding, major joint swelling, breast masses or abnormal vaginal bleeding.       Objective:   Physical Exam BP 130/100  Pulse 84  Temp 97.9 F (36.6 C)  Ht 5\' 1"  (1.549 m)  Wt  212 lb (96.163 kg)  BMI 40.06 kg/m2 BP Readings from Last 3 Encounters:  05/23/12 130/100  12/07/11 114/69  12/02/11 146/89    General:  Well-developed,overweight appearing,in no acute distress; alert,appropriate and cooperative throughout examination Head:  normocephalic and atraumatic.   Eyes:  vision grossly intact, pupils equal, pupils round, and pupils reactive to light.   Ears:  R ear normal and L ear normal.   Nose:  no external deformity.   Mouth:  good dentition.   Lungs:  Normal respiratory effort, chest expands symmetrically. Lungs are clear  to auscultation, no crackles or wheezes. Heart:  Normal rate and regular rhythm. S1 and S2 normal without gallop, murmur, click, rub or other extra sounds. Abdomen:  Bowel sounds positive,abdomen soft and non-tender without masses, organomegaly or hernias noted. Skin:   Several circular lesions with raised borders on right arm, right shoulder and left neck Cervical Nodes:  No lymphadenopathy noted Psych:  Cognition and judgment appear intact. Alert and cooperative with normal attention span and concentration. No apparent delusions, illusions, hallucinations  Assessment and Plan: 1. HTN (hypertension)  New- I suspect she was on a beta blocker during her pregnancy. Will start low dose HCTZ- 12.5 mg daily. She is not breast feeding and not planning on having more children in near future. Check labs today. Follow up in 2 weeks. Comprehensive metabolic panel  2. Anemia  Recheck CBC today.  CBC with Differential  3. Screening for ischemic heart disease  Lipid Panel  4. Tinea  Will send in rx for nizoral cream daily. Will follow up with lesions at her follow up appt in 2 weeks.            Assessment & Plan:

## 2012-05-24 ENCOUNTER — Encounter: Payer: Self-pay | Admitting: *Deleted

## 2012-06-05 ENCOUNTER — Encounter: Payer: Self-pay | Admitting: Family Medicine

## 2012-06-05 ENCOUNTER — Ambulatory Visit (INDEPENDENT_AMBULATORY_CARE_PROVIDER_SITE_OTHER): Payer: BC Managed Care – PPO | Admitting: Family Medicine

## 2012-06-05 VITALS — BP 122/82 | HR 78 | Temp 97.8°F | Resp 18 | Wt 214.0 lb

## 2012-06-05 DIAGNOSIS — B359 Dermatophytosis, unspecified: Secondary | ICD-10-CM

## 2012-06-05 DIAGNOSIS — I10 Essential (primary) hypertension: Secondary | ICD-10-CM

## 2012-06-05 NOTE — Progress Notes (Signed)
Subjective:    Patient ID: Monica Simmons, female    DOB: February 08, 1976, 37 y.o.   MRN: [id number]  HPI  37 yo very pleasant G2P2 originally here for two week follow up HTN.    Just had her second child 5 months ago via C- section. Doing well but pregnancy was complicated by PIH.  States she was "on something for high blood pressure for pregnant women" but was not taking anything when I saw her two weeks ago.  BP was elevated to 130/100 and she was symptomatic.  Started HCTZ 12.5 mg daily two weeks ago and BP much, much better.  States she feels fine.    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  Not responding to OTC lotrimin like they usually do.  Started topical ketoconazole two weeks ago.  Lesions are getting smaller.   Patient Active Problem List  Diagnosis  . Ectopic breast tissue  . HTN (hypertension)  . Tinea   Past Medical History  Diagnosis Date  . Anemia   . Heart murmur   . Asthma     no inhaler  . Postpartum care following cesarean delivery (8/5) 11/30/2011  . Maternal anemia complicating pregnancy, childbirth, or the puerperium 11/30/2011   Past Surgical History  Procedure Laterality Date  . Cesarean section    . Myomectomy    . Cesarean section  11/29/2011    Procedure: CESAREAN SECTION;  Surgeon: Tresa Endo A. Ernestina Penna, MD;  Location: [facility];  Service: Gynecology;  Laterality: N/A;  Requests 75 min.  EDD: 12/16/11   History  Substance Use Topics  . Smoking status: Never Smoker   . Smokeless tobacco: Not on file  . Alcohol Use: No   Family History  Problem Relation Age of Onset  . Hypertension Mother   . Cancer Father     prostate   Allergies  Allergen Reactions  . Other Hives    Powdered latex causes hives    Current Outpatient Prescriptions on File Prior to Visit  Medication Sig Dispense Refill  . hydrochlorothiazide (MICROZIDE) 12.5 MG capsule Take 1 capsule (12.5 mg total) by mouth daily.  30 capsule  1  . Hydrocortisone Butyrate 0.1 % OINT Apply 1 application  topically as needed. For eczema      . ketoconazole (NIZORAL) 2 % cream Apply topically daily.  15 g  0   No current facility-administered medications on file prior to visit.   The PMH, PSH, Social History, Family History, Medications, and allergies have been reviewed in [facility], and have been updated if relevant.  Review of Systems See HPI Patient reports no  vision/ hearing changes,anorexia, weight change, fever ,adenopathy, persistant / recurrent hoarseness, swallowing issues, chest pain, edema,persistant / recurrent cough, hemoptysis, dyspnea(rest, exertional, paroxysmal nocturnal), gastrointestinal  bleeding (melena, rectal bleeding), abdominal pain, excessive heart burn, GU symptoms(dysuria, hematuria, pyuria, voiding/incontinence  Issues) syncope, focal weakness, severe memory loss, concerning skin lesions, depression, anxiety, abnormal bruising/bleeding, major joint swelling, breast masses or abnormal vaginal bleeding.       Objective:   Physical Exam BP 122/82  Pulse 78  Temp(Src) 97.8 F (36.6 C)  Resp 18  Wt 214 lb (97.07 kg)  BMI 40.46 kg/m2   General:  Well-developed,overweight appearing,in no acute distress; alert,appropriate and cooperative throughout examination Head:  normocephalic and atraumatic.   Eyes:  vision grossly intact, pupils equal, pupils round, and pupils reactive to light.   Ears:  R ear normal and L ear normal.   Nose:  no external deformity.  Mouth:  good dentition.   Lungs:  Normal respiratory effort, chest expands symmetrically. Lungs are clear to auscultation, no crackles or wheezes. Heart:  Normal rate and regular rhythm. S1 and S2 normal without gallop, murmur, click, rub or other extra sounds. Abdomen:  Bowel sounds positive,abdomen soft and non-tender without masses, organomegaly or hernias noted. Skin:   Several circular lesions with raised borders on right arm, right shoulder and left neck, improved from prior. Cervical Nodes:  No  lymphadenopathy noted Psych:  Cognition and judgment appear intact. Alert and cooperative with normal attention span and concentration. No apparent delusions, illusions, hallucinations     Assessment & Plan:  1. HTN (hypertension) Well controlled on HCTZ 12.5 mg daily. Continue current dose.    2. Tinea  Improving with topical ketoconazole. Call or return to clinic prn if these symptoms worsen or fail to improve as anticipated.

## 2012-06-05 NOTE — Patient Instructions (Addendum)
Your blood pressure is perfect. Keep taking your current medication.  Your ring worm looks good too!

## 2013-03-30 ENCOUNTER — Ambulatory Visit (INDEPENDENT_AMBULATORY_CARE_PROVIDER_SITE_OTHER): Payer: BC Managed Care – PPO | Admitting: General Surgery

## 2013-05-11 ENCOUNTER — Encounter (INDEPENDENT_AMBULATORY_CARE_PROVIDER_SITE_OTHER): Payer: Self-pay | Admitting: General Surgery

## 2013-05-11 ENCOUNTER — Ambulatory Visit (INDEPENDENT_AMBULATORY_CARE_PROVIDER_SITE_OTHER): Payer: BC Managed Care – PPO | Admitting: General Surgery

## 2013-05-11 ENCOUNTER — Encounter (INDEPENDENT_AMBULATORY_CARE_PROVIDER_SITE_OTHER): Payer: Self-pay

## 2013-05-11 VITALS — BP 144/92 | HR 80 | Temp 98.1°F | Resp 16 | Ht 61.0 in | Wt 232.0 lb

## 2013-05-11 DIAGNOSIS — Z6841 Body Mass Index (BMI) 40.0 and over, adult: Secondary | ICD-10-CM

## 2013-05-11 DIAGNOSIS — E66811 Obesity, class 1: Secondary | ICD-10-CM | POA: Insufficient documentation

## 2013-05-11 NOTE — Progress Notes (Signed)
Subjective:   obesity  Patient ID: Monica Simmons, female   DOB: 04-18-1976, 38 y.o.   MRN: [id number]  HPI [place] [id number] y.o.female presents for consideration for surgical treatment for morbid obesity.  She  gives a history of progressive obesity since the birth of her first child several years ago. She was mildly overweight high school in college but since her first pregnancy and particularly since her second pregnancy one year ago she has had fairly dramatic weight gain and has been unable to get this off despite persistent efforts at diet and exercise.  her weight has been affecting her in a number of ways including the onset of hypertension and increasing difficulty enjoying her routine activities.. She is concerned about her long-term health going forward at her current weight.  She has been to our initial information seminar, researched surgical options thoroughly and is interested in possibly for lap band or a sleeve gastrectomy.  Past Medical History  Diagnosis Date  . Anemia   . Heart murmur   . Asthma     no inhaler  . Postpartum care following cesarean delivery (8/5) 11/30/2011  . Maternal anemia complicating pregnancy, childbirth, or the puerperium 11/30/2011  . Hypertension    Past Surgical History  Procedure Laterality Date  . Cesarean section    . Myomectomy    . Cesarean section  11/29/2011    Procedure: CESAREAN SECTION;  Surgeon: Claiborne Billings A. Pamala Hurry, MD;  Location: [facility];  Service: Gynecology;  Laterality: N/A;  Requests 75 min.  EDD: 12/16/11   Current Outpatient Prescriptions  Medication Sig Dispense Refill  . levonorgestrel (MIRENA) 20 MCG/24HR IUD 1 each by Intrauterine route once.       No current facility-administered medications for this visit.   Allergies  Allergen Reactions  . Other Hives    Powdered latex causes hives    History  Substance Use Topics  . Smoking status: Never Smoker   . Smokeless tobacco: Not on file  . Alcohol Use: Yes     Comment:  occasional     Review of Systems  Constitutional: Negative.   HENT: Negative.   Respiratory: Negative.   Cardiovascular: Negative.   Gastrointestinal: Negative.   Genitourinary: Negative.   Musculoskeletal: Negative.        Objective:   Physical Exam BP 144/92  Pulse 80  Temp(Src) 98.1 F (36.7 C) (Temporal)  Resp 16  Ht 5\' 1"  (1.549 m)  Wt 232 lb (105.235 kg)  BMI 43.86 kg/m2 General: Alert, obese African American female, in no distress Skin: Warm and dry without rash or infection. HEENT: No palpable masses or thyromegaly. Sclera nonicteric. Pupils equal round and reactive. Oropharynx clear. Lymph nodes: No cervical, supraclavicular, or inguinal nodes palpable. Lungs: Breath sounds clear and equal without increased work of breathing Cardiovascular: Regular rate and rhythm without murmur. No JVD or edema. Peripheral pulses intact. Abdomen: Nondistended. Soft and nontender. No masses palpable. No organomegaly. No palpable hernias. Extremities: No edema or joint swelling or deformity. No chronic venous stasis changes. Neurologic: Alert and fully oriented. Gait normal.    Assessment:     38 year old female with progressive morbid obesity that has been unresponsive to nonsurgical intervention who presents with a BMI of 43 with comorbidity of hypertension. We discussed extensively the surgical options available focusing specifically on lap band and sleeve gastrectomy. We discussed pros and cons of each operation. LAP-BAND discussed risks of weight loss failure and long-term risk for slippage and erosion and esophageal dilatation in the  importance of frequent followup. We discussed that sleeve gastrectomy the staple procedure the introducer some risk of bleeding or leakage long-term risks of reflux or stricture. She has given consent forms for both of these procedures and all blood references for further information as well.    Plan:   Patient to consider options and possible lap  band versus sleeve gastrectomy and will call back next week with any further questions for her decision and we will proceed from there.

## 2013-05-15 IMAGING — US US OB DETAIL+14 WK
1 series · 16 of 28 positions shown · non-contrast
Comparison: none

[Series 1: us ob detail+14 wk · 0.22mm/px · 16 of 110 slices shown]
[im 1/110]
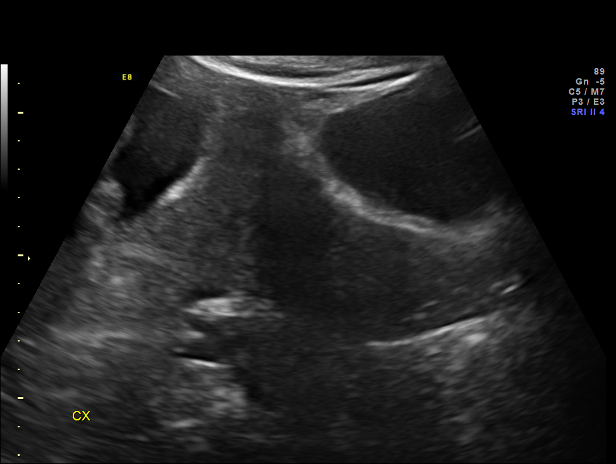
[im 9/110]
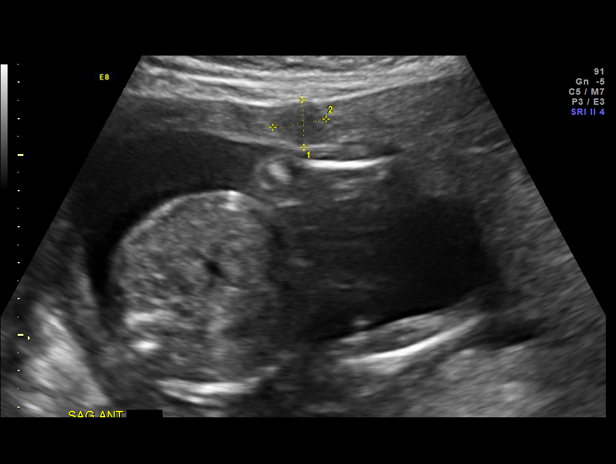
[im 17/110]
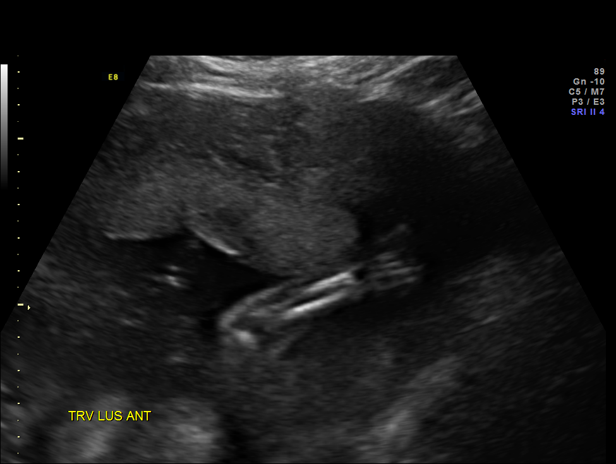
[im 25/110]
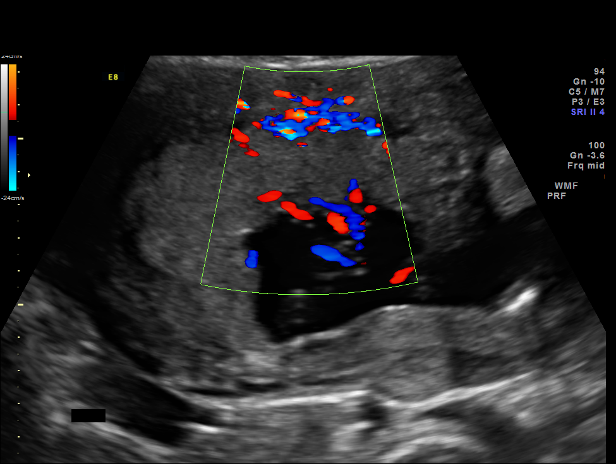
[im 29/110]
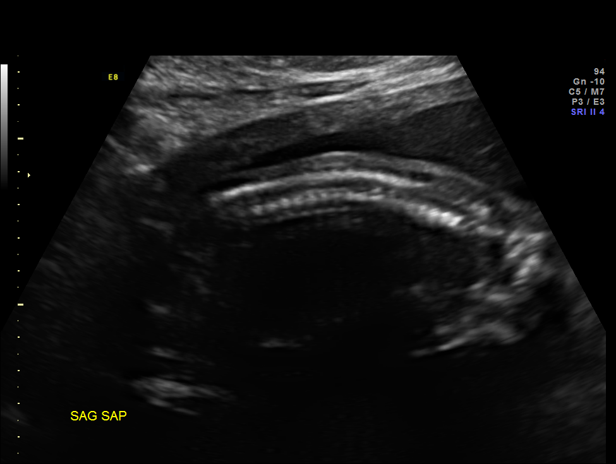
[im 37/110]
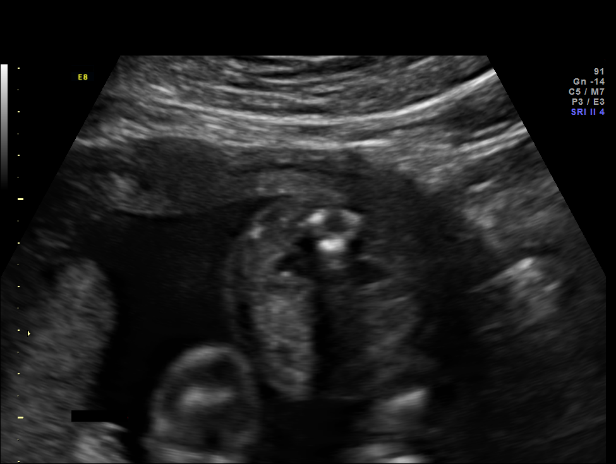
[im 45/110]
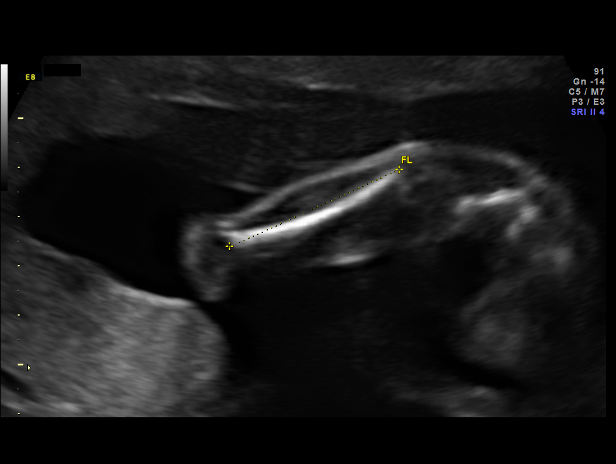
[im 53/110]
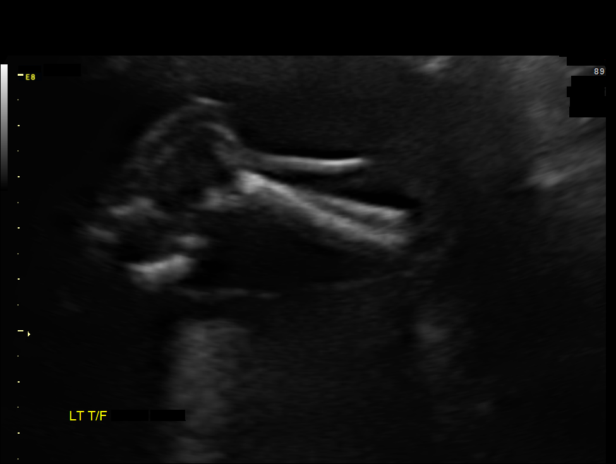
[im 57/110]
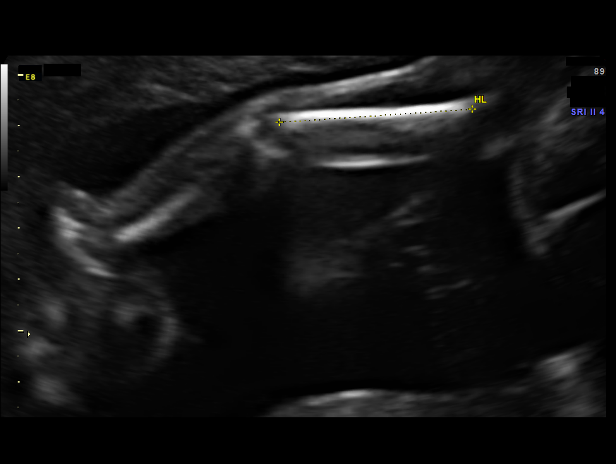
[im 65/110]
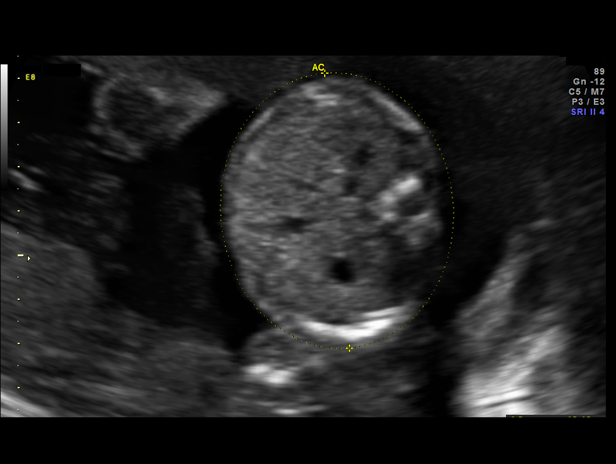
[im 73/110]
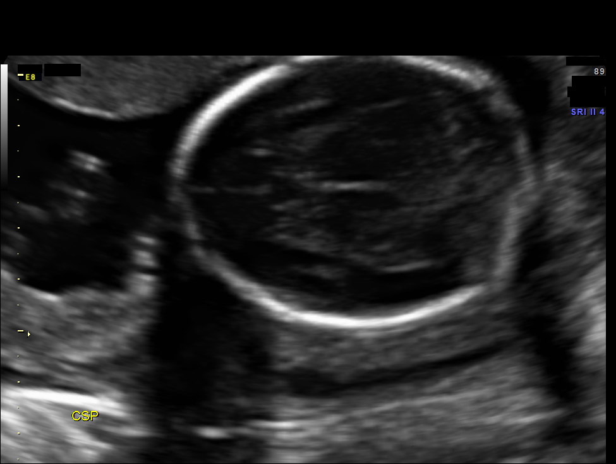
[im 81/110]
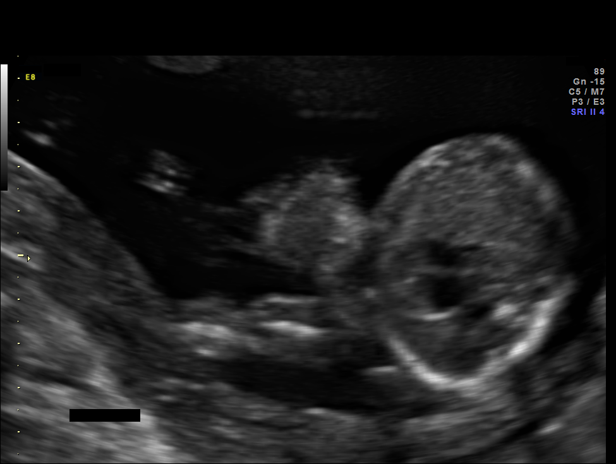
[im 85/110]
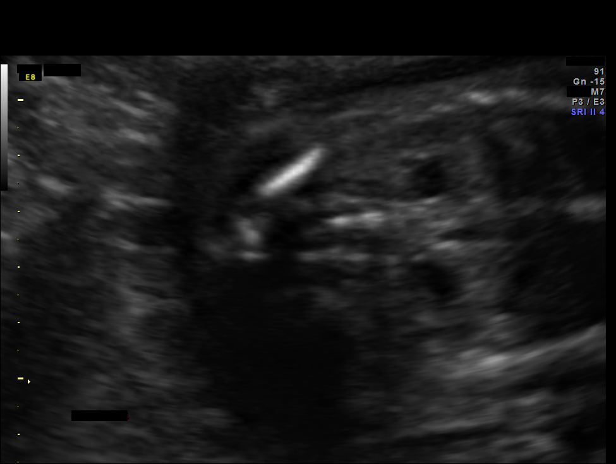
[im 93/110]
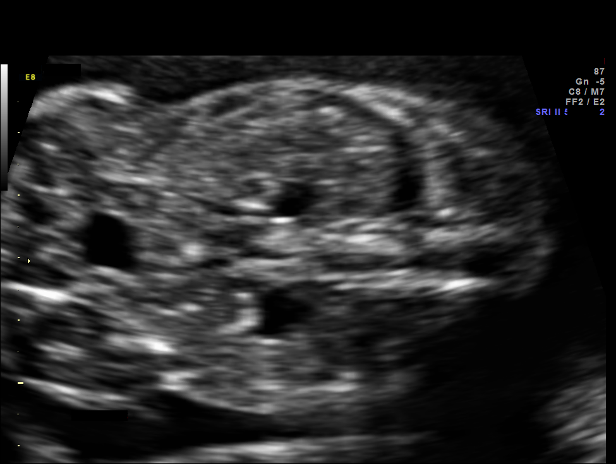
[im 101/110]
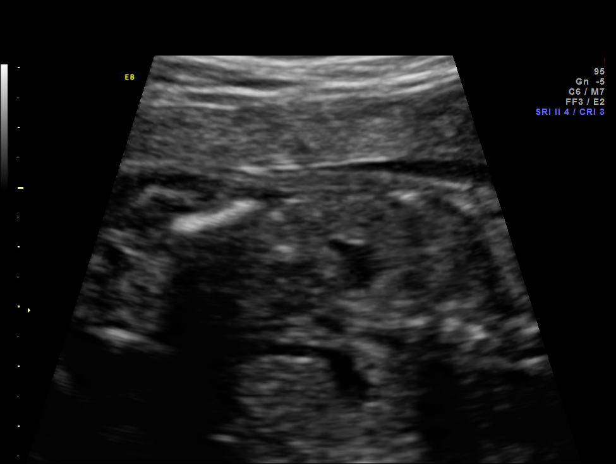
[im 110/110]
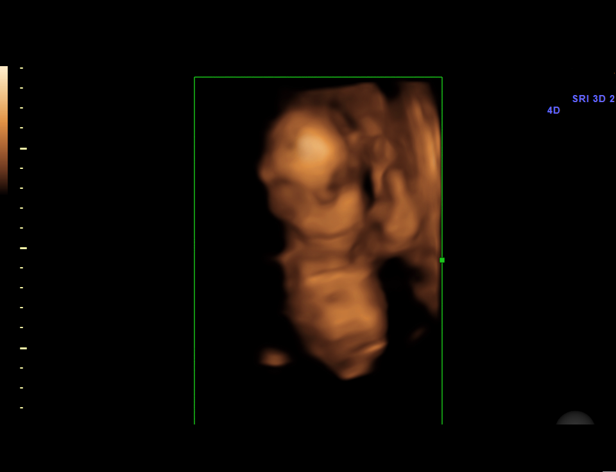

[16 of 28 positions shown; findings below may reference images not displayed]

OBSTETRICS REPORT
                      (Signed Final 08/17/2011 [DATE])

 Order#:         [id number]
Procedures

 US OB DETAIL + 14 WK                                  76811.0
Indications

 Previous cesarean section
 Uterine fibroids
 Hypertension - Chronic/Pre-existing
 Fetal abnormality (echogenic intracardiac focus
 and pyelectasis on office US)
Fetal Evaluation

 Fetal Heart Rate:  171                          bpm
 Cardiac Activity:  Observed
 Presentation:      Cephalic
 Placenta:          Right lateral, above
                    cervical os
 P. Cord            Visualized
 Insertion:

 Amniotic Fluid
 AFI FV:      Subjectively within normal limits
                                             Larg Pckt:     6.1  cm
Biometry

 BPD:     54.3  mm     G. Age:  22w 4d                CI:         75.0   70 - 86
 OFD:     72.4  mm                                    FL/HC:      19.1   19.2 -

 HC:     202.7  mm     G. Age:  22w 3d       25  %    HC/AC:      1.11   1.05 -

 AC:     181.9  mm     G. Age:  23w 0d       52  %    FL/BPD:     71.3   71 - 87
 FL:      38.7  mm     G. Age:  22w 3d       30  %    FL/AC:      21.3   20 - 24
 HUM:       37  mm     G. Age:  22w 6d       49  %
 CER:     24.6  mm     G. Age:  22w 4d       48  %

 Est. FW:     528  gm      1 lb 3 oz     52  %
Gestational Age

 LMP:           22w 5d        Date:  03/11/11                 EDD:   12/16/11
 U/S Today:     22w 4d                                        EDD:   12/17/11
 Best:          22w 5d     Det. By:  LMP  (03/11/11)          EDD:   12/16/11
Anatomy

 Cranium:           Appears normal      Aortic Arch:       Appears normal
 Fetal Cavum:       Appears normal      Ductal Arch:       Not well
                                                           visualized
 Ventricles:        Appears normal      Diaphragm:         Appears normal
 Choroid Plexus:    Appears normal      Stomach:           Appears
                                                           normal, left
                                                           sided
 Cerebellum:        Appears normal      Abdomen:           Appears normal
 Posterior Fossa:   Appears normal      Abdominal Wall:    Appears nml
                                                           (cord insert,
                                                           abd wall)
 Nuchal Fold:       Not applicable      Cord Vessels:      Appears normal
                    (>20 wks GA)                           (3 vessel cord)
 Face:              Appears normal      Kidneys:           Bilat
                    (lips/profile/orbit
                    s)
                                                           pyelectasis, Rt
                                                           5.1 mm, Lt
                                                           mm
 Heart:             Echogenic           Bladder:           Appears normal
                    focus in LV
 RVOT:              Appears normal      Spine:             Appears normal
 LVOT:              Appears normal      Limbs:             Appears normal
                                                           (hands, ankles,
                                                           feet)

 Other:     Fetus appears to be a male. Heels and 5th digit appears
            normal.
Targeted Anatomy

 Fetal Central Nervous System
 Cisterna Magna:
Cervix Uterus Adnexa

 Cervix:       Normal appearance by transabdominal scan. Appears
               closed, without funnelling.
 Left Ovary:    Within normal limits.
 Right Ovary:   Within normal limits.
Myomas

 Site                     L(cm)      W(cm)       D(cm)      Location
 Anterior Fundus          1.3        1.5         1.6        Intramural
 Anterior Fundus          1.8        2.7         2.6        Intramural
 Anterior LUS             4.7        3.3         4.3        Intramural
 Blood Flow                  RI       PI       Comments

Impression

 IUP at 22+5 weeks
 Normal detailed fetal anatomy; DA not optimally visualized
 Markers of aneuploidy: EIF and mild, bilateral pyelectasis
 Normal amniotic fluid volume
 Measurements consistent with LMP dating
 Fibroid uterus: see above for size and location

 The US findings were shared with Ms. Preissler.  The
 implications of the above two markers on her T21 risk were
 discussed in detail.  All of her prenatal testing options were
 reviewed.  After careful consideration, she declined
 amniocentesis and all further prenatal testing.
Recommendations

 Follow-up pyelectasis with ultrasounds; if pelvis is 10 mm or
 greater at any gestational age or if pelvis is 7 mm or greater
 after 33 weeks, please refer back for further evaluation

 questions or concerns.

## 2013-05-21 ENCOUNTER — Telehealth (INDEPENDENT_AMBULATORY_CARE_PROVIDER_SITE_OTHER): Payer: Self-pay | Admitting: General Surgery

## 2013-05-21 NOTE — Telephone Encounter (Signed)
Pt called to let Dr. Excell Seltzer know she had decided to go forward with the gastric sleeve surgery.  Will notify him of her decision, and she will hear from Bariatric coordinator with additional appointments as they are set up.

## 2013-05-25 ENCOUNTER — Other Ambulatory Visit (INDEPENDENT_AMBULATORY_CARE_PROVIDER_SITE_OTHER): Payer: Self-pay | Admitting: General Surgery

## 2013-05-31 ENCOUNTER — Other Ambulatory Visit (INDEPENDENT_AMBULATORY_CARE_PROVIDER_SITE_OTHER): Payer: Self-pay

## 2013-06-25 ENCOUNTER — Ambulatory Visit (HOSPITAL_COMMUNITY)
Admission: RE | Admit: 2013-06-25 | Discharge: 2013-06-25 | Disposition: A | Discharge status: Home / Self Care | Payer: BC Managed Care – PPO | Source: Ambulatory Visit | Attending: General Surgery | Admitting: General Surgery

## 2013-06-25 ENCOUNTER — Telehealth (INDEPENDENT_AMBULATORY_CARE_PROVIDER_SITE_OTHER): Payer: Self-pay | Admitting: General Surgery

## 2013-06-25 ENCOUNTER — Other Ambulatory Visit: Payer: Self-pay

## 2013-06-25 DIAGNOSIS — I10 Essential (primary) hypertension: Secondary | ICD-10-CM | POA: Insufficient documentation

## 2013-06-25 DIAGNOSIS — D649 Anemia, unspecified: Secondary | ICD-10-CM | POA: Insufficient documentation

## 2013-06-25 DIAGNOSIS — Z6841 Body Mass Index (BMI) 40.0 and over, adult: Secondary | ICD-10-CM | POA: Insufficient documentation

## 2013-06-25 NOTE — Telephone Encounter (Signed)
Called pt and left message to call back re UGI series results

## 2013-06-28 ENCOUNTER — Other Ambulatory Visit (INDEPENDENT_AMBULATORY_CARE_PROVIDER_SITE_OTHER): Payer: Self-pay | Admitting: Surgery

## 2013-06-30 ENCOUNTER — Encounter (INDEPENDENT_AMBULATORY_CARE_PROVIDER_SITE_OTHER): Payer: Self-pay

## 2013-06-30 ENCOUNTER — Encounter: Payer: BC Managed Care – PPO | Attending: Surgery | Admitting: Dietician

## 2013-06-30 ENCOUNTER — Encounter: Payer: Self-pay | Admitting: Dietician

## 2013-06-30 VITALS — Ht 61.0 in | Wt 238.4 lb

## 2013-06-30 DIAGNOSIS — Z713 Dietary counseling and surveillance: Secondary | ICD-10-CM | POA: Insufficient documentation

## 2013-06-30 DIAGNOSIS — Z6841 Body Mass Index (BMI) 40.0 and over, adult: Secondary | ICD-10-CM

## 2013-06-30 NOTE — Patient Instructions (Signed)
Patient to call the [facility] to enroll in Pre-Op and Post-Op Nutrition Education when surgery date is scheduled. 

## 2013-06-30 NOTE — Progress Notes (Signed)
  Pre-Op Assessment Visit:  Pre-Operative Gastric Sleeve Surgery  Medical Nutrition Therapy:  Appt start time: 0800   End time:  0845.  Patient was seen on 06/30/2013 for Pre-Operative Gastric Sleeve Nutrition Assessment. Assessment and letter of approval faxed to [facility] B[facility] Surgery Program coordinator on 06/30/2013.   Preferred Learning Style:   Visual  Hands on  Learning Readiness:  Ready  Handouts given during visit include:  Pre-Op Goals Bariatric Surgery Protein Shakes Bariatric Fast Food Guide  Teaching Method Utilized:  Visual Auditory Hands on  Barriers to learning/adherence to lifestyle change: none  Demonstrated degree of understanding via:  Teach Back   Patient to call the [facility] to enroll in Pre-Op and Post-Op Nutrition Education when surgery date is scheduled.

## 2013-06-30 NOTE — Progress Notes (Signed)
Supplement samples provided:  Celebrate MVI [facility] [place]) Lot #: [id number] Exp: 2/16  Bariactiv Calcium [place]) Lot#: [id number] Exp: 6/16  Bariatric Advantage B12 ([place]) Lot#: [id number] Exp: 10/15  Unjury (Unflavored) Lot #: [id number] Exp: 12/15  Premier Protein (Strawberries and Cream) Lot #: [id number] Exp: 12/15

## 2013-07-04 ENCOUNTER — Telehealth (INDEPENDENT_AMBULATORY_CARE_PROVIDER_SITE_OTHER): Payer: Self-pay | Admitting: Surgery

## 2013-07-04 NOTE — Telephone Encounter (Signed)
Pt will call back to schedule sx when has deposit/et

## 2013-07-26 ENCOUNTER — Encounter: Payer: Self-pay | Admitting: Family Medicine

## 2013-07-26 ENCOUNTER — Ambulatory Visit (INDEPENDENT_AMBULATORY_CARE_PROVIDER_SITE_OTHER): Payer: BC Managed Care – PPO | Admitting: Family Medicine

## 2013-07-26 VITALS — BP 132/70 | HR 70 | Temp 97.9°F | Ht 60.75 in | Wt 239.2 lb

## 2013-07-26 DIAGNOSIS — Z6841 Body Mass Index (BMI) 40.0 and over, adult: Secondary | ICD-10-CM

## 2013-07-26 NOTE — Progress Notes (Signed)
   Subjective:   Patient ID: Baldwin Jamaica, female    DOB: 10-Jul-1975, 38 y.o.   MRN: [id number]  Laurelyn Terrero is a pleasant 38 y.o. year old female who presents to clinic today with Weight Loss  on 07/26/2013  HPI: Referred her to bariatric surgery- saw Dr. Excell Seltzer.  Patient was seen on 06/30/2013 for Pre-Operative Gastric Sleeve Nutrition Assessment. Assessment and letter of approval faxed to [facility] B[facility] coordinator on 06/30/2013- reviewed in [place].  She does have to have an endoscopy this month.  Still planning on having this procedure done.  She needs a letter of medical necessity from me.  Wt Readings from Last 3 Encounters:  07/26/13 239 lb 4 oz (108.523 kg)  06/30/13 238 lb 6.4 oz (108.138 kg)  05/11/13 232 lb (105.235 kg)      Review of Systems See HPI Denies any anxiety    Objective:    BP 132/70  Pulse 70  Temp(Src) 97.9 F (36.6 C) (Oral)  Ht 5' 0.75" (1.543 m)  Wt 239 lb 4 oz (108.523 kg)  BMI 45.58 kg/m2  SpO2 97%   Physical Exam  Gen:  Alert, pleasant obese female, NAD Psych:  Good eye contact, not anxious or depressed appearing      Assessment & Plan:   Morbid obesity with BMI of 40.0-44.9, adult No Follow-up on file.

## 2013-07-26 NOTE — Assessment & Plan Note (Signed)
Medical necessity letter written. Copy given to patient, one faxed and one sent electronically to Dr. Excell Seltzer.

## 2013-07-26 NOTE — Progress Notes (Signed)
Pre visit review using our clinic review tool, if applicable. No additional management support is needed unless otherwise documented below in the visit note. 

## 2013-08-01 LAB — TSH: TSH: 1.219 u[IU]/mL (ref 0.350–4.500)

## 2013-08-01 LAB — CBC WITH DIFFERENTIAL/PLATELET
BASOS ABS: 0 10*3/uL (ref 0.0–0.1)
BASOS PCT: 0 % (ref 0–1)
Eosinophils Absolute: 0.1 10*3/uL (ref 0.0–0.7)
Eosinophils Relative: 1 % (ref 0–5)
HEMATOCRIT: 43.3 % (ref 36.0–46.0)
Hemoglobin: 14.3 g/dL (ref 12.0–15.0)
LYMPHS PCT: 23 % (ref 12–46)
Lymphs Abs: 2.6 10*3/uL (ref 0.7–4.0)
MCH: 28 pg (ref 26.0–34.0)
MCHC: 33 g/dL (ref 30.0–36.0)
MCV: 84.7 fL (ref 78.0–100.0)
MONO ABS: 0.8 10*3/uL (ref 0.1–1.0)
Monocytes Relative: 7 % (ref 3–12)
NEUTROS ABS: 7.8 10*3/uL — AB (ref 1.7–7.7)
NEUTROS PCT: 69 % (ref 43–77)
PLATELETS: 347 10*3/uL (ref 150–400)
RBC: 5.11 MIL/uL (ref 3.87–5.11)
RDW: 13.5 % (ref 11.5–15.5)
WBC: 11.3 10*3/uL — AB (ref 4.0–10.5)

## 2013-08-01 LAB — T4: T4, Total: 9.2 ug/dL (ref 5.0–12.5)

## 2013-08-01 LAB — HCG, SERUM, QUALITATIVE: Preg, Serum: NEGATIVE

## 2013-08-02 ENCOUNTER — Other Ambulatory Visit (INDEPENDENT_AMBULATORY_CARE_PROVIDER_SITE_OTHER): Payer: Self-pay

## 2013-08-03 ENCOUNTER — Telehealth (INDEPENDENT_AMBULATORY_CARE_PROVIDER_SITE_OTHER): Payer: Self-pay

## 2013-08-03 NOTE — Telephone Encounter (Signed)
Called and spoke to patient regarding her question about proceeding with EGD even if she does not proceed with scheduling surgery until mid summer.  Per Dr. Excell Seltzer patient should continue EGD as scheduled for further work-up.  Patient agreeable at this time.

## 2013-08-03 NOTE — Telephone Encounter (Signed)
Message copied by Ivor Costa on Fri Aug 03, 2013  4:22 PM ------      Message from: Overton Mam      Created: Thu Aug 02, 2013  1:06 PM      Regarding: Pt has questions about Upper Endo        She has her upper endo sch with Dr. Lucia Gaskins on 4/14 but is a bari pt of Dr. Lear Ng.  She has questions about doing this procedure.            Can you please call her at [phone]?   I told her I would pass on the message but did not know when you would be able to call her back.            Thanks ------

## 2013-08-07 ENCOUNTER — Ambulatory Visit (HOSPITAL_COMMUNITY)
Admission: RE | Admit: 2013-08-07 | Discharge: 2013-08-07 | Disposition: A | Discharge status: Home / Self Care | Payer: BC Managed Care – PPO | Source: Ambulatory Visit | Attending: Surgery | Admitting: Surgery

## 2013-08-07 ENCOUNTER — Encounter (HOSPITAL_COMMUNITY)
Admission: RE | Disposition: A | Discharge status: Home / Self Care | Payer: Self-pay | Source: Ambulatory Visit | Attending: Surgery

## 2013-08-07 SURGERY — EGD (ESOPHAGOGASTRODUODENOSCOPY)
Anesthesia: Moderate Sedation

## 2014-01-23 ENCOUNTER — Encounter: Payer: Self-pay | Admitting: Family Medicine

## 2014-01-23 ENCOUNTER — Ambulatory Visit (INDEPENDENT_AMBULATORY_CARE_PROVIDER_SITE_OTHER): Payer: BC Managed Care – PPO | Admitting: Family Medicine

## 2014-01-23 VITALS — BP 158/88 | HR 79 | Temp 98.3°F | Wt 243.5 lb

## 2014-01-23 DIAGNOSIS — Q838 Other congenital malformations of breast: Secondary | ICD-10-CM

## 2014-01-23 DIAGNOSIS — I10 Essential (primary) hypertension: Secondary | ICD-10-CM

## 2014-01-23 MED ORDER — HYDROCHLOROTHIAZIDE 12.5 MG PO CAPS: 12.5000 mg | ORAL_CAPSULE | Freq: Every day | ORAL | Status: DC

## 2014-01-23 NOTE — Assessment & Plan Note (Signed)
Deteriorated. Restart HCTZ 12.5 mg daily. Follow up in 2 weeks- at that time will check BMET. The patient indicates understanding of these issues and agrees with the plan.

## 2014-01-23 NOTE — Assessment & Plan Note (Signed)
Persistent but she is more symptomatic. She would like to check with her bariatric surgeon - she is planning on bariatric surgery- before I refer her back to general surgeon to remove accessory breast tissue.

## 2014-01-23 NOTE — Patient Instructions (Signed)
Great to see you. We are restarting HCTZ 12.5 mg daily for your blood pressure. Please come see me in 2 weeks for a follow up.

## 2014-01-23 NOTE — Progress Notes (Signed)
Pre visit review using our clinic review tool, if applicable. No additional management support is needed unless otherwise documented below in the visit note. 

## 2014-01-23 NOTE — Progress Notes (Signed)
Subjective:   Patient ID: Monica Simmons, female    DOB: [birth year], 38 y.o.   MRN: [id number]  Monica Simmons is a pleasant 38 y.o. year old female who presents to clinic today with Cyst and Dizziness  on 01/23/2014  HPI: HTN- was previously on HCTZ but stopped it because BP was well controlled. Has been feeling more dizzy lately which is how she used to feel when her BP was elevated in past. No syncope. No HA. No blurred vision. No CP or SOB.  Accessory breast tissue- wants to be referred back to surgeon I referred her to years ago to have it removed.  Current Outpatient Prescriptions on File Prior to Visit  Medication Sig Dispense Refill  . levonorgestrel (MIRENA) 20 MCG/24HR IUD 1 each by Intrauterine route once.       No current facility-administered medications on file prior to visit.    Allergies  Allergen Reactions  . Other Hives    Powdered latex causes hives     Past Medical History  Diagnosis Date  . Anemia   . Heart murmur   . Asthma     no inhaler  . Postpartum care following cesarean delivery (8/5) 11/30/2011  . Maternal anemia complicating pregnancy, childbirth, or the puerperium 11/30/2011  . Hypertension     Past Surgical History  Procedure Laterality Date  . Cesarean section    . Myomectomy    . Cesarean section  11/29/2011    Procedure: CESAREAN SECTION;  Surgeon: Claiborne Billings A. Pamala Hurry, MD;  Location: [facility];  Service: Gynecology;  Laterality: N/A;  Requests 75 min.  EDD: 12/16/11    Family History  Problem Relation Age of Onset  . Hypertension Mother   . Cancer Father     prostate  . Cancer Paternal Grandmother     breast    History   Social History  . Marital Status: Married    Spouse Name: N/A    Number of Children: 1  . Years of Education: N/A   Occupational History  . teacher    Social History Main Topics  . Smoking status: Never Smoker   . Smokeless tobacco: Not on file  . Alcohol Use: Yes     Comment: occasional  . Drug Use: No    . Sexual Activity: Not on file   Other Topics Concern  . Not on file   Social History Narrative  . No narrative on file   The PMH, PSH, Social History, Family History, Medications, and allergies have been reviewed in [facility], and have been updated if relevant.   Review of Systems    See HPI NO LE edema No abscess or masses Breast tissue not painful just "aggrevating" No abdominal pain No nausea or vomiting. Objective:    BP 158/88  Pulse 79  Temp(Src) 98.3 F (36.8 C) (Oral)  Wt 243 lb 8 oz (110.451 kg)  SpO2 94%   Physical Exam  General: Well-developed,overweight appearing,in no acute distress; alert,appropriate and cooperative throughout examination  Head: normocephalic and atraumatic.  Eyes: vision grossly intact, pupils equal, pupils round, and pupils reactive to light.  Ears: R ear normal and L ear normal.  Nose: no external deformity.  Mouth: good dentition.  Neck: No deformities, masses, or tenderness noted.  Breasts: Adjacent to left breast, under axilla- accessory fatty tissue, non tender to palpation  Lungs: Normal respiratory effort, chest expands symmetrically. Lungs are clear to auscultation, no crackles or wheezes.  Heart: Normal rate and regular rhythm. S1  and S2 normal without gallop, murmur, click, rub or other extra sounds.  Msk: No deformity or scoliosis noted of thoracic or lumbar spine.  Extremities: No clubbing, cyanosis, edema, or deformity noted with normal full range of motion of all joints.  Neurologic: alert & oriented X3 and gait normal.  Skin: Intact without suspicious lesions or rashes  Psych: Cognition and judgment appear intact. Alert and cooperative with normal attention span and concentration. No apparent delusions, illusions, hallucinations          Assessment & Plan:   Essential hypertension  Ectopic breast tissue No Follow-up on file.

## 2014-01-24 ENCOUNTER — Telehealth: Payer: Self-pay | Admitting: Family Medicine

## 2014-01-24 NOTE — Telephone Encounter (Signed)
emmi emailed °

## 2014-02-11 ENCOUNTER — Ambulatory Visit: Payer: BC Managed Care – PPO | Admitting: Family Medicine

## 2014-02-20 ENCOUNTER — Ambulatory Visit (INDEPENDENT_AMBULATORY_CARE_PROVIDER_SITE_OTHER): Payer: BC Managed Care – PPO | Admitting: Family Medicine

## 2014-02-20 ENCOUNTER — Encounter: Payer: Self-pay | Admitting: Family Medicine

## 2014-02-20 VITALS — BP 120/88 | HR 83 | Temp 97.9°F | Wt 245.0 lb

## 2014-02-20 DIAGNOSIS — I10 Essential (primary) hypertension: Secondary | ICD-10-CM

## 2014-02-20 DIAGNOSIS — Z6841 Body Mass Index (BMI) 40.0 and over, adult: Secondary | ICD-10-CM

## 2014-02-20 NOTE — Progress Notes (Signed)
Pre visit review using our clinic review tool, if applicable. No additional management support is needed unless otherwise documented below in the visit note. 

## 2014-02-20 NOTE — Patient Instructions (Signed)
Great to see you. Your blood pressure looks great. Good luck with your appointment at [facility].

## 2014-02-20 NOTE — Progress Notes (Signed)
Subjective:   Patient ID: Baldwin Jamaica, female    DOB: [birth year], 38 y.o.   MRN: [id number]  Timmya Blazier is a pleasant 38 y.o. year old female who presents to clinic today with Follow-up  on 02/20/2014  HPI:  HTN- was previously on HCTZ but stopped it because BP was well controlled. When I saw her on 01/23/14, she had been feeling more dizzy like how she used to feel when BP was elevated in past. No syncope. No HA. No blurred vision. No CP or SOB. BP was 159/88 at that office visit, so I restarted her HCTZ 12.5 mg daily.   BP Readings from Last 3 Encounters:  02/20/14 120/88  01/23/14 158/88  07/26/13 132/70   Lab Results  Component Value Date   CREATININE 0.6 05/23/2012   Lab Results  Component Value Date   NA 139 05/23/2012   K 4.0 05/23/2012   CL 105 05/23/2012   CO2 27 05/23/2012      Has appt with bariatric surgeon at [facility] scheduled for next month.  She is looking forward to it..  Current Outpatient Prescriptions on File Prior to Visit  Medication Sig Dispense Refill  . hydrochlorothiazide (MICROZIDE) 12.5 MG capsule Take 1 capsule (12.5 mg total) by mouth daily.  30 capsule  1  . levonorgestrel (MIRENA) 20 MCG/24HR IUD 1 each by Intrauterine route once.       No current facility-administered medications on file prior to visit.    Allergies  Allergen Reactions  . Other Hives    Powdered latex causes hives     Past Medical History  Diagnosis Date  . Anemia   . Heart murmur   . Asthma     no inhaler  . Postpartum care following cesarean delivery (8/5) 11/30/2011  . Maternal anemia complicating pregnancy, childbirth, or the puerperium 11/30/2011  . Hypertension     Past Surgical History  Procedure Laterality Date  . Cesarean section    . Myomectomy    . Cesarean section  11/29/2011    Procedure: CESAREAN SECTION;  Surgeon: Claiborne Billings A. Pamala Hurry, MD;  Location: [facility];  Service: Gynecology;  Laterality: N/A;  Requests 75 min.  EDD: 12/16/11    Family  History  Problem Relation Age of Onset  . Hypertension Mother   . Cancer Father     prostate  . Cancer Paternal Grandmother     breast    History   Social History  . Marital Status: Married    Spouse Name: N/A    Number of Children: 1  . Years of Education: N/A   Occupational History  . teacher    Social History Main Topics  . Smoking status: Never Smoker   . Smokeless tobacco: Never Used  . Alcohol Use: Yes     Comment: occasional  . Drug Use: No  . Sexual Activity: Not on file   Other Topics Concern  . Not on file   Social History Narrative  . No narrative on file   The PMH, PSH, Social History, Family History, Medications, and allergies have been reviewed in [facility], and have been updated if relevant.   Review of Systems    See HPI NO LE edema No abscess or masses No CP No SOB No dizziness No blurred vision Objective:    BP 120/88  Pulse 83  Temp(Src) 97.9 F (36.6 C) (Oral)  Wt 245 lb (111.131 kg)  SpO2 99%   Physical Exam   General: Well-developed,overweight appearing,in no  acute distress; alert,appropriate and cooperative throughout examination  Head: normocephalic and atraumatic.  Eyes: vision grossly intact, pupils equal, pupils round, and pupils reactive to light.  Lungs: Normal respiratory effort, chest expands symmetrically. Lungs are clear to auscultation, no crackles or wheezes.  Heart: Normal rate and regular rhythm. S1 and S2 normal without gallop, murmur, click, rub or other extra sounds.  Msk: No deformity or scoliosis noted of thoracic or lumbar spine.  Extremities: No clubbing, cyanosis, edema, or deformity noted with normal full range of motion of all joints.  Neurologic: alert & oriented X3 and gait normal.  Skin: Intact without suspicious lesions or rashes  Psych: Cognition and judgment appear intact. Alert and cooperative with normal attention span and concentration. No apparent delusions, illusions, hallucinations            Assessment & Plan:   Essential hypertension  Morbid obesity with BMI of 40.0-44.9, adult No Follow-up on file.

## 2014-02-20 NOTE — Assessment & Plan Note (Signed)
Has appt with bariatric surgeon.  She will update me after the appointment.

## 2014-02-20 NOTE — Assessment & Plan Note (Signed)
Improved. Continue current dose of HCTZ. Call or return to clinic prn if these symptoms worsen or fail to improve as anticipated. The patient indicates understanding of these issues and agrees with the plan.

## 2014-02-25 ENCOUNTER — Encounter: Payer: Self-pay | Admitting: Family Medicine

## 2015-03-24 IMAGING — RF DG UGI W/ KUB
15 of 20 series · 15 of 20 positions shown · non-contrast
Comparison: None.

FLUOROSCOPY TIME:  3 min 6 seconds

CLINICAL DATA: Morbid obesity. Pre-op evaluation for bariatric
surgery.

EXAM:
UPPER GI SERIES W/ KUB
TECHNIQUE: After obtaining a scout radiograph a routine upper GI series was
performed using thin barium.

[Series 1: run · 1 of 1 slices shown (1 of 13)]
[im 1/1]
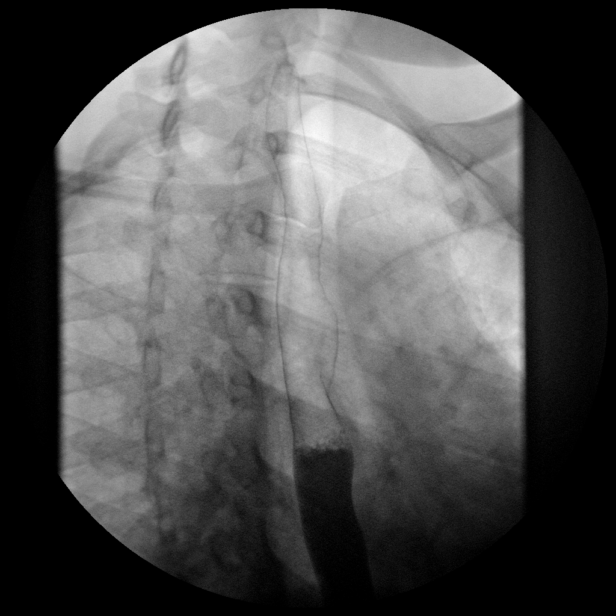

[Series 3: run · 1 of 1 slices shown (2 of 13)]
[im 1/1]
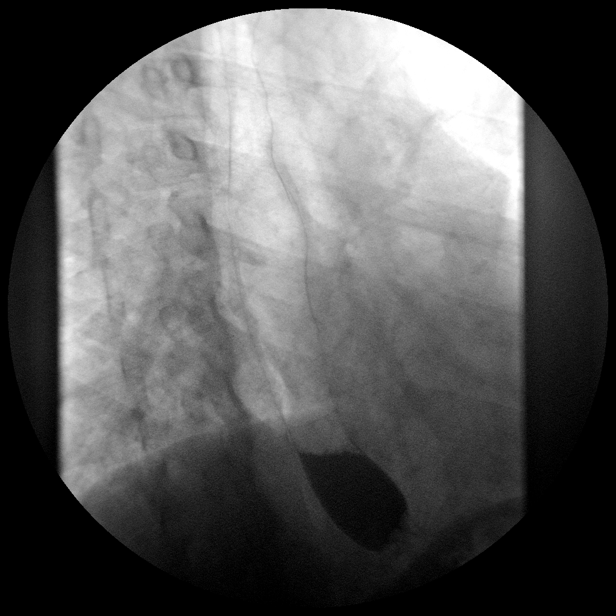

[Series 4: run · 1 of 1 slices shown (3 of 13)]
[im 1/1]
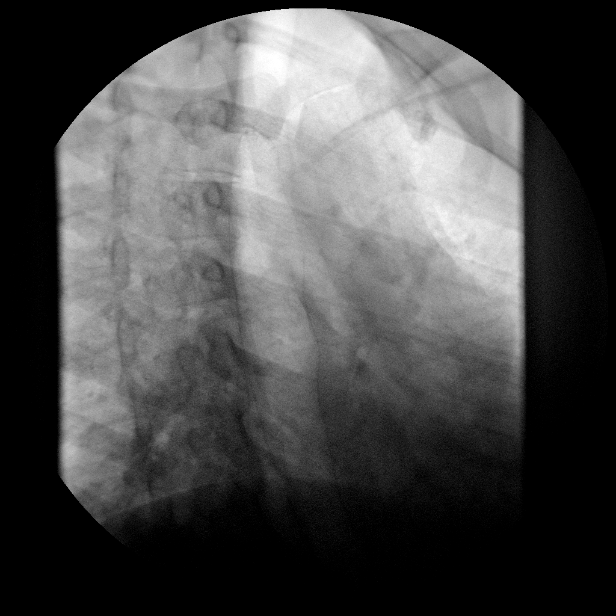

[Series 5: run · 1 of 1 slices shown (4 of 13)]
[im 1/1]
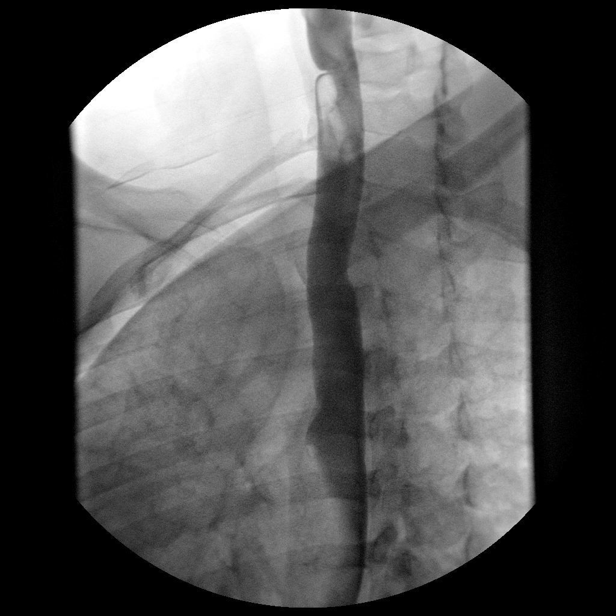

[Series 7: run · 1 of 1 slices shown (5 of 13)]
[im 1/1]
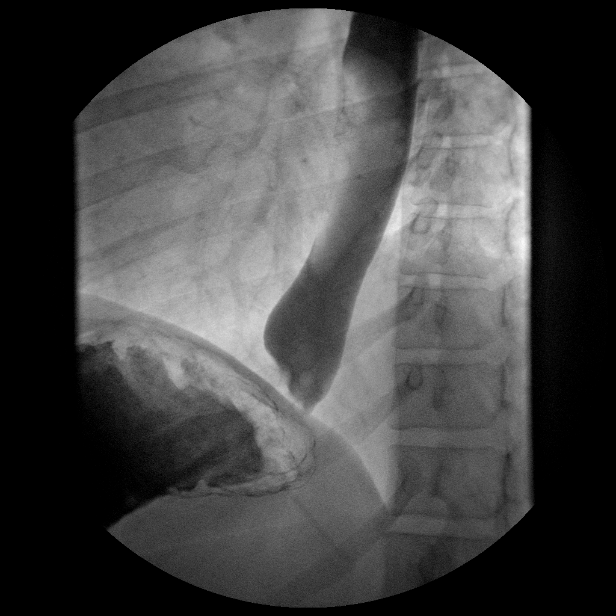

[Series 8: run · 1 of 1 slices shown (6 of 13)]
[im 1/1]
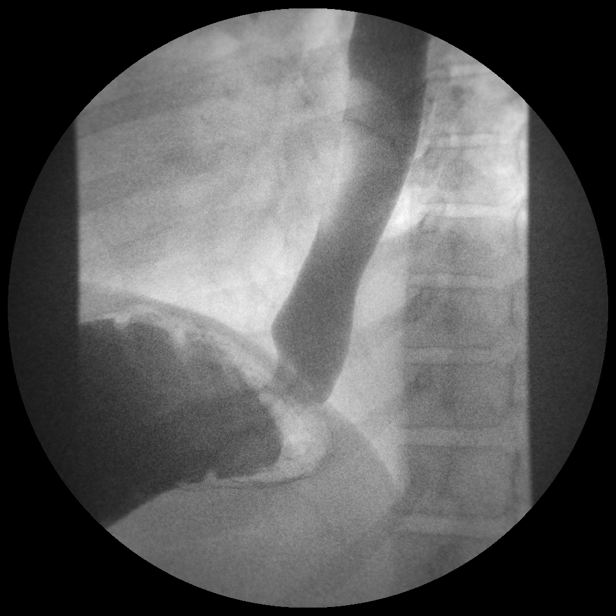

[Series 9: run · 1 of 1 slices shown (7 of 13)]
[im 1/1]
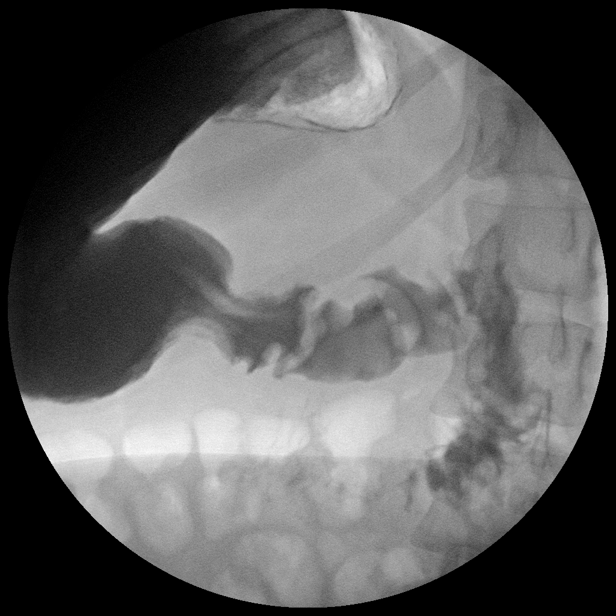

[Series 11: run · 1 of 1 slices shown (8 of 13)]
[im 1/1]
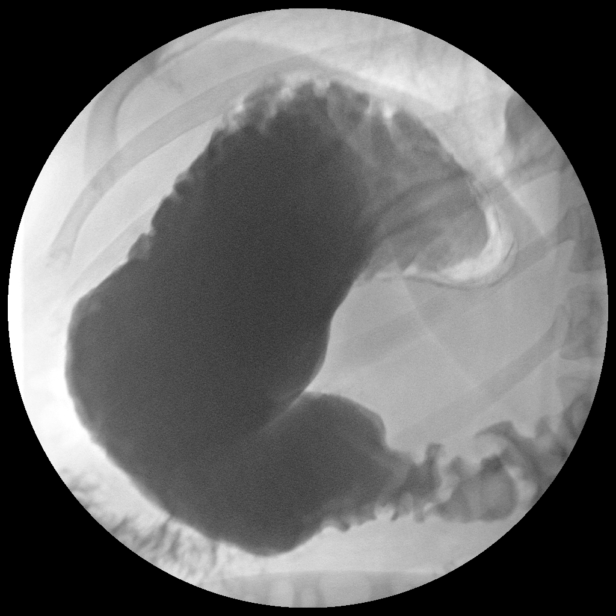

[Series 12: run · 1 of 1 slices shown (9 of 13)]
[im 1/1]
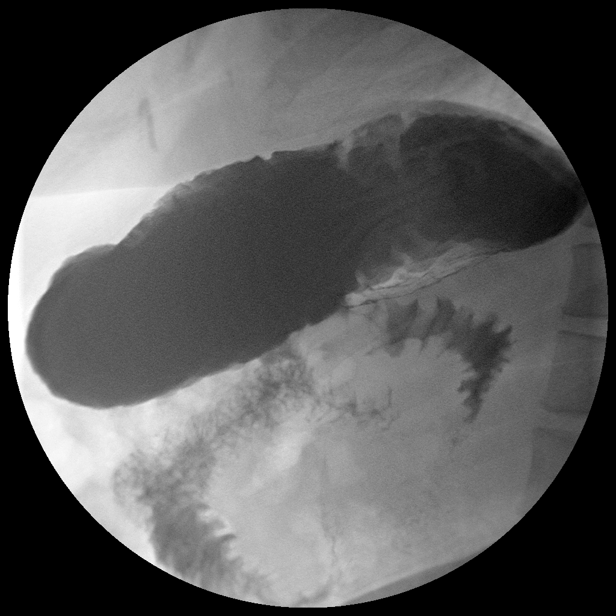

[Series 13: run · 1 of 1 slices shown (10 of 13)]
[im 1/1]
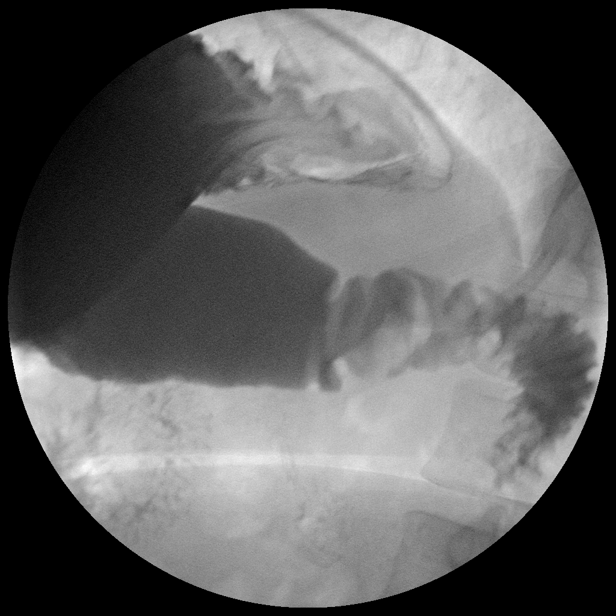

[Series 15: run · 1 of 1 slices shown (11 of 13)]
[im 1/1]
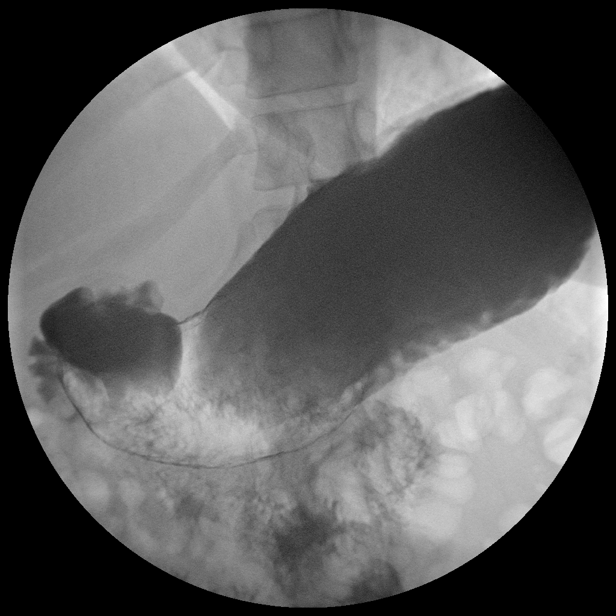

[Series 16: run · 1 of 1 slices shown (12 of 13)]
[im 1/1]
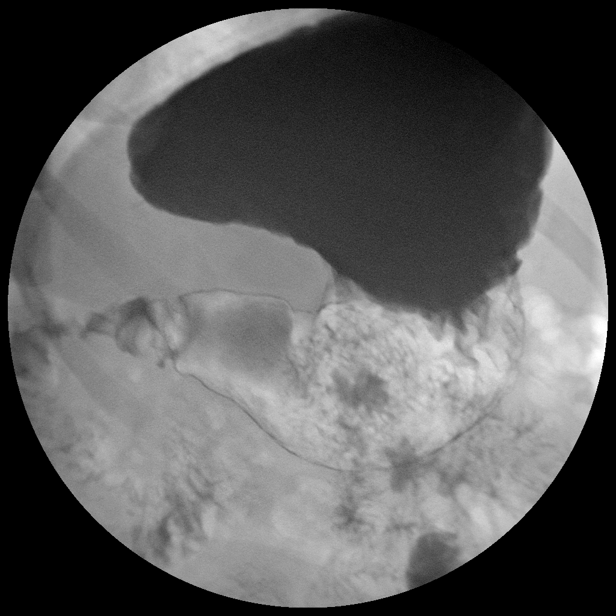

[Series 17: run · 1 of 1 slices shown (13 of 13)]
[im 1/1]
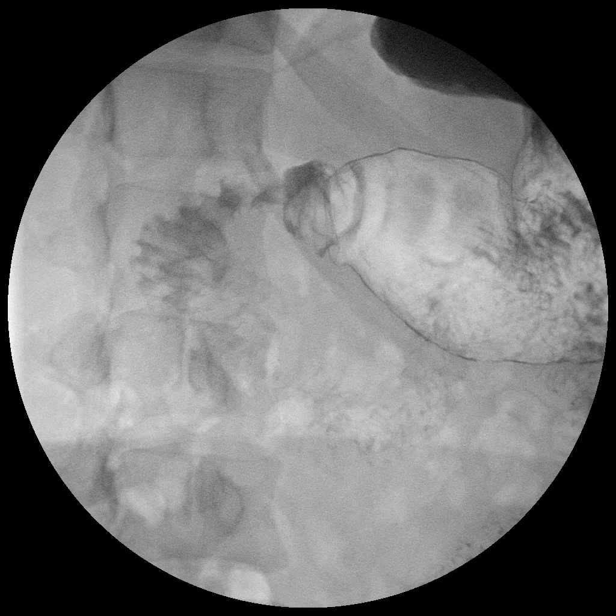

[Series 1001: view not recorded · 0.20mm/px · 1 of 1 slices shown (1 of 2)]
[im 1/1]
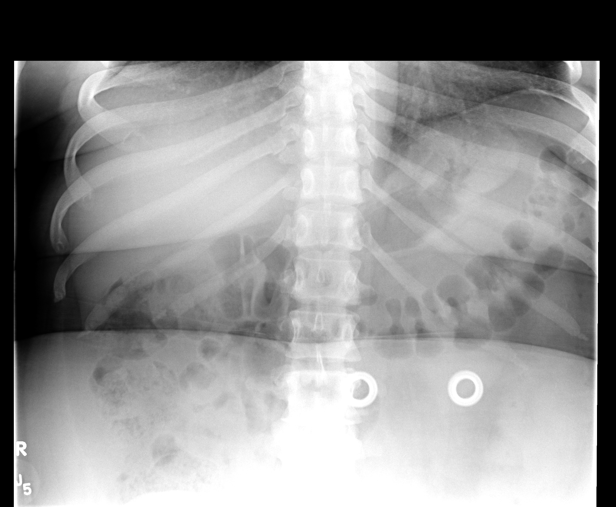

[Series 1002: view not recorded · 0.20mm/px · 1 of 1 slices shown (2 of 2)]
[im 1/1]
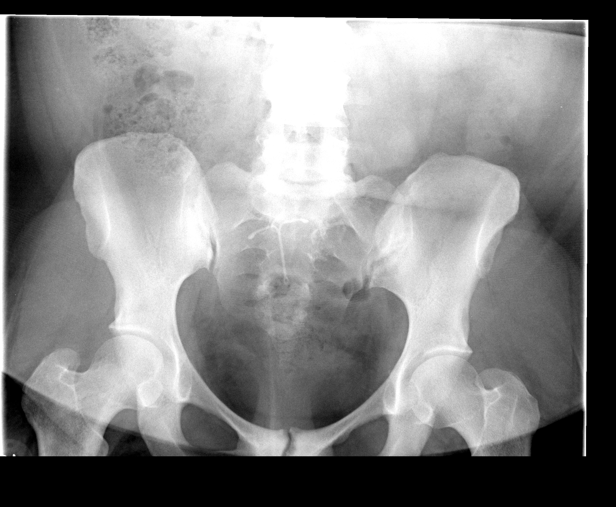

[15 of 20 positions shown; findings below may reference images not displayed]

FINDINGS: The scout radiograph shows a normal bowel gas pattern. IUD noted in
the pelvis, as well as pubic symphysis degenerative changes.

There is no evidence of esophageal mass or stricture. There is no
evidence of hiatal hernia, and no gastroesophageal reflux was seen
during the exam. Esophageal motility is within normal limits.

There is no evidence of gastric masses or ulcers. Persistent fold
thickening is seen in the gastric antrum, which is a nonspecific
finding but is suspicious for gastritis or other infiltrative
gastropathy. Duodenal bulb and sweep are normal in appearance.
IMPRESSION: Persistent fold thickening in the gastric antrum, without evidence
of discrete gastric ulcer or mass. This is nonspecific, but
suspicious for gastritis or other infiltrative gastropathy.
Endoscopy should be considered for further evaluation.

No evidence of hiatal hernia or esophageal stricture.

## 2015-11-05 ENCOUNTER — Telehealth: Payer: Self-pay

## 2015-11-05 NOTE — Telephone Encounter (Signed)
Spoke to pt and scheduled CPE 

## 2015-11-05 NOTE — Telephone Encounter (Signed)
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

## 2015-11-12 ENCOUNTER — Encounter: Payer: BC Managed Care – PPO | Admitting: Family Medicine

## 2018-09-07 ENCOUNTER — Telehealth: Payer: Self-pay | Admitting: Family Medicine

## 2018-09-07 NOTE — Telephone Encounter (Signed)
Called because it has been a while since pt last saw Dr Deborra Medina and wasn't sure if pt still wanted to see Dr Deborra Medina as PCP and if not we need to update, if she does we need to schedule an appt.

## 2018-11-04 ENCOUNTER — Other Ambulatory Visit: Payer: Self-pay

## 2018-11-04 DIAGNOSIS — Z20822 Contact with and (suspected) exposure to covid-19: Secondary | ICD-10-CM

## 2018-11-10 LAB — NOVEL CORONAVIRUS, NAA: SARS-CoV-2, NAA: NOT DETECTED

## 2018-12-16 ENCOUNTER — Other Ambulatory Visit: Payer: Self-pay

## 2018-12-16 DIAGNOSIS — Z20822 Contact with and (suspected) exposure to covid-19: Secondary | ICD-10-CM

## 2018-12-17 LAB — NOVEL CORONAVIRUS, NAA: SARS-CoV-2, NAA: NOT DETECTED

## 2019-03-03 ENCOUNTER — Other Ambulatory Visit: Payer: Self-pay

## 2019-03-03 DIAGNOSIS — Z20822 Contact with and (suspected) exposure to covid-19: Secondary | ICD-10-CM

## 2019-03-04 LAB — NOVEL CORONAVIRUS, NAA: SARS-CoV-2, NAA: NOT DETECTED

## 2019-04-04 NOTE — Telephone Encounter (Signed)
Patient called in and has made an appointment to see Dr.Aron 12/16. Pt did want to have Dr.Aron continue as her PCP. Please advise.

## 2019-04-11 ENCOUNTER — Ambulatory Visit: Payer: BC Managed Care – PPO | Admitting: Family Medicine

## 2019-05-14 LAB — HM PAP SMEAR: HM Pap smear: NEGATIVE

## 2020-02-14 ENCOUNTER — Other Ambulatory Visit: Payer: Self-pay | Admitting: Obstetrics

## 2020-02-14 DIAGNOSIS — N920 Excessive and frequent menstruation with regular cycle: Secondary | ICD-10-CM

## 2020-02-14 DIAGNOSIS — D500 Iron deficiency anemia secondary to blood loss (chronic): Secondary | ICD-10-CM

## 2020-02-14 DIAGNOSIS — D219 Benign neoplasm of connective and other soft tissue, unspecified: Secondary | ICD-10-CM

## 2020-03-04 ENCOUNTER — Ambulatory Visit (HOSPITAL_COMMUNITY): Payer: BC Managed Care – PPO

## 2020-03-06 NOTE — Discharge Instructions (Signed)

## 2020-03-07 ENCOUNTER — Other Ambulatory Visit: Payer: Self-pay

## 2020-03-07 ENCOUNTER — Ambulatory Visit (HOSPITAL_COMMUNITY)
Admission: RE | Admit: 2020-03-07 | Discharge: 2020-03-07 | Disposition: A | Discharge status: Home / Self Care | Payer: BC Managed Care – PPO | Source: Ambulatory Visit | Attending: Obstetrics | Admitting: Obstetrics

## 2020-03-07 DIAGNOSIS — N920 Excessive and frequent menstruation with regular cycle: Secondary | ICD-10-CM | POA: Diagnosis present

## 2020-03-07 DIAGNOSIS — D500 Iron deficiency anemia secondary to blood loss (chronic): Secondary | ICD-10-CM

## 2020-03-07 DIAGNOSIS — D219 Benign neoplasm of connective and other soft tissue, unspecified: Secondary | ICD-10-CM | POA: Insufficient documentation

## 2020-03-07 MED ORDER — SODIUM CHLORIDE 0.9 % IV SOLN
500.0000 mg | INTRAVENOUS | Status: DC
  Administered 2020-03-07: 500 mg via INTRAVENOUS
  Filled 2020-03-07: qty 25

## 2020-03-21 ENCOUNTER — Other Ambulatory Visit: Payer: Self-pay

## 2020-03-21 ENCOUNTER — Ambulatory Visit (HOSPITAL_COMMUNITY)
Admission: RE | Admit: 2020-03-21 | Discharge: 2020-03-21 | Disposition: A | Discharge status: Home / Self Care | Payer: BC Managed Care – PPO | Source: Ambulatory Visit | Attending: Obstetrics | Admitting: Obstetrics

## 2020-03-21 DIAGNOSIS — D500 Iron deficiency anemia secondary to blood loss (chronic): Secondary | ICD-10-CM | POA: Insufficient documentation

## 2020-03-21 DIAGNOSIS — N920 Excessive and frequent menstruation with regular cycle: Secondary | ICD-10-CM | POA: Diagnosis present

## 2020-03-21 DIAGNOSIS — D219 Benign neoplasm of connective and other soft tissue, unspecified: Secondary | ICD-10-CM | POA: Diagnosis present

## 2020-03-21 MED ORDER — SODIUM CHLORIDE 0.9 % IV SOLN
500.0000 mg | INTRAVENOUS | Status: DC
  Administered 2020-03-21: 500 mg via INTRAVENOUS
  Filled 2020-03-21: qty 25

## 2020-03-21 NOTE — Progress Notes (Signed)
Patient received 2nd dose of IV Venofer as ordered. At end of infusion (during 30 minute observation) patient reported swelling in both hands, worse on the right (arm where Venofer was infused). Some swelling noted up right arm as well as hives. Patient reported happening with first infusion of Venofer but was milder with no hives and was only on one arm. Patient reported taking 50 mg oral Benadryl that she had with her today. Swelling and hives improved slightly after about 30 minutes. Pt verbalized understanding to return to ED with worsening of symptoms. This nurse also called Gavin Potters, midwife through answering service for Dr. Aloha Gell as office closed today. No new orders received. Pt discharged home at 2:45. Venofer also added to allergy list.

## 2020-07-23 LAB — HM MAMMOGRAPHY

## 2020-11-26 ENCOUNTER — Ambulatory Visit (INDEPENDENT_AMBULATORY_CARE_PROVIDER_SITE_OTHER): Payer: BC Managed Care – PPO | Admitting: Family Medicine

## 2020-11-26 ENCOUNTER — Encounter: Payer: Self-pay | Admitting: Family Medicine

## 2020-11-26 ENCOUNTER — Ambulatory Visit: Payer: Self-pay | Admitting: Family Medicine

## 2020-11-26 ENCOUNTER — Other Ambulatory Visit: Payer: Self-pay

## 2020-11-26 VITALS — BP 132/86 | HR 96 | Ht 61.0 in | Wt 182.2 lb

## 2020-11-26 DIAGNOSIS — D509 Iron deficiency anemia, unspecified: Secondary | ICD-10-CM | POA: Diagnosis not present

## 2020-11-26 DIAGNOSIS — E559 Vitamin D deficiency, unspecified: Secondary | ICD-10-CM

## 2020-11-26 DIAGNOSIS — R635 Abnormal weight gain: Secondary | ICD-10-CM

## 2020-11-26 DIAGNOSIS — Z903 Acquired absence of stomach [part of]: Secondary | ICD-10-CM | POA: Diagnosis not present

## 2020-11-26 DIAGNOSIS — R5383 Other fatigue: Secondary | ICD-10-CM | POA: Diagnosis not present

## 2020-11-26 DIAGNOSIS — E538 Deficiency of other specified B group vitamins: Secondary | ICD-10-CM

## 2020-11-26 DIAGNOSIS — Z7689 Persons encountering health services in other specified circumstances: Secondary | ICD-10-CM

## 2020-11-26 DIAGNOSIS — E669 Obesity, unspecified: Secondary | ICD-10-CM

## 2020-11-26 NOTE — Progress Notes (Signed)
Subjective:    Patient ID: Monica Simmons, female    DOB: 06-May-1975, 45 y.o.   MRN: [id number]  Monica Simmons is a 45 y.o. female presenting on 11/26/2020 for [place] PCP Dr Deborra Medina, at [facility] no longer at that practice. Here to establish with new PCP  Her GYN is in [place]. Dr Pamala Hurry.   HPI  Obesity BMI >34 [facility] s/p Gastric Sleeve surgery done 06/2014 History of prior weight up to 270 lbs prior to surgery She did reach her goal at wt 150 lbs Concerns about her weight overall. Now 6 years later, she is worried about weight gradual increasing, now weight up to 180 lbs Admits feeling more sluggish and not as normal She takes MVI and Vitamin B12 energy supplement to help with her energy  In past she was on Phentermine for weight. Limited results after.   Lifestyle  Admits some issues with eating regularly, and less activity.  History of Iron Deficiency Anemia Previously managed by her GYN She had allergic reaction to Venofer in past. Would need other therapy. No longer having menorrhagia  Has tried oral iron in past as well  She works as a Education officer, museum in [place]. Lives locally in [place] Uterine Fibroids Menorrhagia    Health Maintenance:  Last pap smear within past 1 year, from [facility], Dr Pamala Hurry  Depression screen [facility] 2/9 11/26/2020  Decreased Interest 1  Down, Depressed, Hopeless 0  PHQ - 2 Score 1  Altered sleeping 0  Tired, decreased energy 1  Change in appetite 2  Feeling bad or failure about yourself  0  Trouble concentrating 0  Moving slowly or fidgety/restless 0  Suicidal thoughts 0  PHQ-9 Score 4  Difficult doing work/chores Not difficult at all    Past Medical History:  Diagnosis Date   Anemia    Asthma    no inhaler   Heart murmur    Hypertension    Maternal anemia complicating pregnancy, childbirth, or the puerperium 11/30/2011   Postpartum care following cesarean delivery (8/5) 11/30/2011    Past Surgical History:  Procedure Laterality Date   CESAREAN SECTION     CESAREAN SECTION  11/29/2011   Procedure: CESAREAN SECTION;  Surgeon: Claiborne Billings A. Pamala Hurry, MD;  Location: [facility];  Service: Gynecology;  Laterality: N/A;  Requests 75 min.  EDD: 12/16/11   MYOMECTOMY     Social History   Socioeconomic History   Marital status: Married    Spouse name: Not on file   Number of children: 1   Years of education: Not on file   Highest education level: Not on file  Occupational History   Occupation: Pharmacist, hospital  Tobacco Use   Smoking status: Former    Types: Cigarettes   Smokeless tobacco: Never  Substance and Sexual Activity   Alcohol use: Yes    Comment: occasional   Drug use: No   Sexual activity: Not on file  Other Topics Concern   Not on file  Social History Narrative   Not on file   Social Determinants of Health   Financial Resource Strain: Not on file  Food Insecurity: Not on file  Transportation Needs: Not on file  Physical Activity: Not on file  Stress: Not on file  Social Connections: Not on file  Intimate Partner Violence: Not on file   Family History  Problem Relation Age of Onset   Hypertension Mother    Cancer Father  prostate   Cancer Paternal Grandmother        breast   No current outpatient medications on file prior to visit.   No current facility-administered medications on file prior to visit.    Review of Systems Per HPI unless specifically indicated above      Objective:    BP 132/86   Pulse 96   Ht '5\' 1"'$  (1.549 m)   Wt 182 lb 3.2 oz (82.6 kg)   BMI 34.43 kg/m   Wt Readings from Last 3 Encounters:  11/26/20 182 lb 3.2 oz (82.6 kg)  02/20/14 245 lb (111.1 kg)  01/23/14 243 lb 8 oz (110.5 kg)    Physical Exam Vitals and nursing note reviewed.  Constitutional:      General: She is not in acute distress.    Appearance: She is well-developed. She is obese. She is not diaphoretic.     Comments: Well-appearing, comfortable,  cooperative  HENT:     Head: Normocephalic and atraumatic.  Eyes:     General:        Right eye: No discharge.        Left eye: No discharge.     Conjunctiva/sclera: Conjunctivae normal.  Neck:     Thyroid: No thyromegaly.  Cardiovascular:     Rate and Rhythm: Normal rate and regular rhythm.     Heart sounds: Normal heart sounds. No murmur heard. Pulmonary:     Effort: Pulmonary effort is normal. No respiratory distress.     Breath sounds: Normal breath sounds. No wheezing or rales.  Musculoskeletal:        General: Normal range of motion.     Cervical back: Normal range of motion and neck supple.  Lymphadenopathy:     Cervical: No cervical adenopathy.  Skin:    General: Skin is warm and dry.     Findings: No erythema or rash.  Neurological:     Mental Status: She is alert and oriented to person, place, and time.  Psychiatric:        Behavior: Behavior normal.     Comments: Well groomed, good eye contact, normal speech and thoughts   Results for orders placed or performed in visit on 03/03/19  Novel Coronavirus, NAA ([facility])   Specimen: Nasopharyngeal(NP) swabs in vial transport medium   NASOPHARYNGE  SCREENIN  Result Value Ref Range   SARS-CoV-2, NAA Not Detected Not Detected      Assessment & Plan:   Problem List Items Addressed This Visit     S/P gastric sleeve procedure   Relevant Orders   COMPLETE METABOLIC PANEL WITH GFR   CBC with Differential/Platelet   Hemoglobin A1c   Vitamin B12   VITAMIN D 25 Hydroxy (Vit-D Deficiency, Fractures)   Iron, TIBC and Ferritin Panel   Folate   TSH   Iron deficiency anemia - Primary   Relevant Orders   CBC with Differential/Platelet   Iron, TIBC and Ferritin Panel   Folate   Other Visit Diagnoses     Abnormal weight gain       Fatigue, unspecified type       Encounter to establish care with new doctor       Obesity (BMI 35.0-39.9 without comorbidity)       Relevant Orders   COMPLETE METABOLIC PANEL WITH GFR    Vitamin B12 nutritional deficiency       Relevant Orders   Vitamin B12   Vitamin D deficiency       Relevant  Orders   VITAMIN D 25 Hydroxy (Vit-D Deficiency, Fractures)      Fatigue Obesity BMI >35 Weight Management S/p Gastric Sleeve 2016 at [facility]  Abnormal wt gain now in past 6 years, wt up to 180 lbs Discussed symptoms may be related to wt and also with maybe nutrition deficiency, will check broad lab panel today and evaluate sources of fatigue and weight  If ultimately determine needs medication assistance for weight management, and unable to be successful with lifestyle intervention diet/exercise - we can pursue GLP1 therapy if able, she can check into options for this and based on lab results determine which option is available.  Iron deficiency anemia history Check labs CBC Iron panel today No menorrhagia Prior IV iron venofer with allergic reaction F/u results may warrant treatment oral iron or refer to Heme  Orders Placed This Encounter  Procedures   COMPLETE METABOLIC PANEL WITH GFR   CBC with Differential/Platelet   Hemoglobin A1c   Vitamin B12   VITAMIN D 25 Hydroxy (Vit-D Deficiency, Fractures)   Iron, TIBC and Ferritin Panel   Folate   TSH     No orders of the defined types were placed in this encounter.     Follow up plan: Return in about 3 months (around 02/26/2021) for 6 week or 3 month follow-up based on lab results.  Nobie Putnam, [facility] 11/26/2020, 3:25 PM

## 2020-11-26 NOTE — Patient Instructions (Addendum)
Thank you for coming to the office today.  Blood work today to look into causes of your symptoms  If blood work comes back normal we can consider other options for weight management.  -------------------------------------  If interested   [facility] [place], [place] [address] Ph: [phone]  ------------------------------------------------  Call insurance find cost and coverage of the following  Same meds but for weight loss Wegovy (weekly, but limited availability now due to back order) [place]   These are for Diabetes or Pre Diabetes 1. Ozempic (Semaglutide injection)  2. Trulicity (Dulaglutide) - once weekly   We can get these newer meds at low cost if you are interested.  3 benefits - 1 significantly reduced A1c sugar, and may be able to reduce or stop metformin in future - 2 reduced appetite and weight loss with good results - 3 cardiovascular risk reduction, less likely to have heart attack/stroke     Please schedule a Follow-up Appointment to: Return in about 3 months (around 02/26/2021) for 6 week or 3 month follow-up based on lab results.  If you have any other questions or concerns, please feel free to call the office or send a message through [place]. You may also schedule an earlier appointment if necessary.  Additionally, you may be receiving a survey about your experience at our office within a few days to 1 week by e-mail or mail. We value your feedback.  Nobie Putnam, DO [place]

## 2020-11-27 LAB — IRON,TIBC AND FERRITIN PANEL
%SAT: 15 % (calc) — ABNORMAL LOW (ref 16–45)
Ferritin: 11 ng/mL — ABNORMAL LOW (ref 16–232)
Iron: 55 ug/dL (ref 40–190)
TIBC: 359 mcg/dL (calc) (ref 250–450)

## 2020-11-27 LAB — CBC WITH DIFFERENTIAL/PLATELET
Absolute Monocytes: 800 cells/uL (ref 200–950)
Basophils Absolute: 52 cells/uL (ref 0–200)
Basophils Relative: 0.6 %
Eosinophils Absolute: 218 cells/uL (ref 15–500)
Eosinophils Relative: 2.5 %
HCT: 39.4 % (ref 35.0–45.0)
Hemoglobin: 12.3 g/dL (ref 11.7–15.5)
Lymphs Abs: 2575 cells/uL (ref 850–3900)
MCH: 27.3 pg (ref 27.0–33.0)
MCHC: 31.2 g/dL — ABNORMAL LOW (ref 32.0–36.0)
MCV: 87.4 fL (ref 80.0–100.0)
MPV: 10.6 fL (ref 7.5–12.5)
Monocytes Relative: 9.2 %
Neutro Abs: 5055 cells/uL (ref 1500–7800)
Neutrophils Relative %: 58.1 %
Platelets: 341 10*3/uL (ref 140–400)
RBC: 4.51 10*6/uL (ref 3.80–5.10)
RDW: 12.1 % (ref 11.0–15.0)
Total Lymphocyte: 29.6 %
WBC: 8.7 10*3/uL (ref 3.8–10.8)

## 2020-11-27 LAB — COMPLETE METABOLIC PANEL WITH GFR
AG Ratio: 1.7 (calc) (ref 1.0–2.5)
ALT: 15 U/L (ref 6–29)
AST: 20 U/L (ref 10–30)
Albumin: 4.3 g/dL (ref 3.6–5.1)
Alkaline phosphatase (APISO): 55 U/L (ref 31–125)
BUN: 10 mg/dL (ref 7–25)
CO2: 30 mmol/L (ref 20–32)
Calcium: 9.8 mg/dL (ref 8.6–10.2)
Chloride: 105 mmol/L (ref 98–110)
Creat: 0.65 mg/dL (ref 0.50–0.99)
Globulin: 2.6 g/dL (calc) (ref 1.9–3.7)
Glucose, Bld: 86 mg/dL (ref 65–139)
Potassium: 4.4 mmol/L (ref 3.5–5.3)
Sodium: 141 mmol/L (ref 135–146)
Total Bilirubin: 0.8 mg/dL (ref 0.2–1.2)
Total Protein: 6.9 g/dL (ref 6.1–8.1)
eGFR: 113 mL/min/{1.73_m2} (ref >=60)

## 2020-11-27 LAB — HEMOGLOBIN A1C
Hgb A1c MFr Bld: 5.1 % of total Hgb (ref <5.7)
Mean Plasma Glucose: 100 mg/dL
eAG (mmol/L): 5.5 mmol/L

## 2020-11-27 LAB — VITAMIN B12: Vitamin B-12: 1493 pg/mL — ABNORMAL HIGH (ref 200–1100)

## 2020-11-27 LAB — TSH: TSH: 0.97 mIU/L

## 2020-11-27 LAB — VITAMIN D 25 HYDROXY (VIT D DEFICIENCY, FRACTURES): Vit D, 25-Hydroxy: 21 ng/mL — ABNORMAL LOW (ref 30–100)

## 2020-11-27 LAB — FOLATE: Folate: 6.5 ng/mL

## 2020-12-04 ENCOUNTER — Encounter: Payer: Self-pay | Admitting: Family Medicine

## 2020-12-08 ENCOUNTER — Telehealth: Payer: Self-pay | Admitting: Family Medicine

## 2020-12-08 NOTE — Telephone Encounter (Signed)
Patient would like to know if she needs to do the iron therapy first before she can be prescribe medication for weight loss. Patient wants to know if she can do both is possible.  CB: [phone]

## 2020-12-08 NOTE — Telephone Encounter (Signed)
She can do both at same time.  You may start with regular OTC Ferrous Sulfate '325mg'$  (65Fe) take 1 daily with meal and work up to 1 pill twice a day with meals to see if this helps. If needed, please reach out and notify us if not improving we would refer you to Hematology for consultation on IV iron therapy again.  2. Weight loss medicine - GLP1 injectable, Wegovy or Saxenda. However we are facing major back order on Wegovy, likely only limited to [place] at this time.  Depending on samples she could start a sample and then follow-up with me 2-3 weeks later to order her more.  She should FIRST check with insurance on cost/coverage of Saxenda before we pursue that.  If we do not have samples then if she checks cost first then let me know and I can try to order it.  Nobie Putnam, [facility] 12/08/2020, 2:57 PM

## 2020-12-31 ENCOUNTER — Telehealth: Payer: Self-pay

## 2020-12-31 DIAGNOSIS — R635 Abnormal weight gain: Secondary | ICD-10-CM

## 2020-12-31 DIAGNOSIS — E669 Obesity, unspecified: Secondary | ICD-10-CM

## 2020-12-31 MED ORDER — NOVOFINE PEN NEEDLE 32G X 6 MM MISC: 3 refills | Status: AC

## 2020-12-31 MED ORDER — SAXENDA 18 MG/3ML ~~LOC~~ SOPN: PEN_INJECTOR | SUBCUTANEOUS | 0 refills | Status: AC

## 2020-12-31 NOTE — Telephone Encounter (Signed)
She does not have Pre Diabetes, therefore only option available is [place]. Sent new rx. Thank you!  Nobie Putnam, [facility] 12/31/2020, 12:15 PM

## 2020-12-31 NOTE — Telephone Encounter (Unsigned)
Copied from [place] [phone]. Topic: General - Other >> Dec 30, 2020  4:14 PM Erick Blinks wrote: [facility], regarding weight loss (prediabetic medication), states insurance can cover medication options if PCP submits a Prior Authorization  Fax: [phone]

## 2020-12-31 NOTE — Telephone Encounter (Signed)
Patient was advised back in mid August to contact her [employer] to determine coverage on a weight loss medication.   I received a message that her insurance will cover the medication but I don't know at what cost for a copay... only to submit a PA.   The patient never let me know what medication she wanted to try, but the note from before mentioned Saxenda because Mancel Parsons is on back order.   Please advise and let me know which one has been ordered and I will proceed with a PA.

## 2021-02-19 ENCOUNTER — Other Ambulatory Visit: Payer: Self-pay | Admitting: Family Medicine

## 2021-02-19 DIAGNOSIS — E669 Obesity, unspecified: Secondary | ICD-10-CM

## 2021-02-19 DIAGNOSIS — R635 Abnormal weight gain: Secondary | ICD-10-CM

## 2021-02-19 NOTE — Telephone Encounter (Signed)
Requested medications are due for refill today yes  Requested medications are on the active medication list yes  Last refill 01/06/21  Last visit 11/26/20  Future visit scheduled NO was asked to return in 3 months  Notes to clinic no upcoming appt for visit and labs as requested, also signature needs to be updated. Please assess.  Requested Prescriptions  Pending Prescriptions Disp Refills   SAXENDA 18 MG/3ML SOPN [Pharmacy Med Name: SAXENDA 6MG /ML PEN INJ 3ML] 15 mL 0    Sig: INJECT 0.6 MG INTO THE SKIN ONCE DAILY FOR 1 WEEK. AS TOLERATED INCREASE BY 0.6 MG EVERY WEEK. MAX 3 MG DAILY AFTER 5 WEEKS     Endocrinology:  Diabetes - GLP-1 Receptor Agonists Passed - 02/19/2021 10:03 AM      Passed - HBA1C is between 0 and 7.9 and within 180 days    Hgb A1c MFr Bld  Date Value Ref Range Status  11/26/2020 5.1 <5.7 % of total Hgb Final    Comment:    For the purpose of screening for the presence of diabetes: . <5.7%       Consistent with the absence of diabetes 5.7-6.4%    Consistent with increased risk for diabetes             (prediabetes) > or =6.5%  Consistent with diabetes . This assay result is consistent with a decreased risk of diabetes. . Currently, no consensus exists regarding use of hemoglobin A1c for diagnosis of diabetes in children. . According to American Diabetes Association (ADA) guidelines, hemoglobin A1c <7.0% represents optimal control in non-pregnant diabetic patients. Different metrics may apply to specific patient populations.  Standards of Medical Care in Diabetes(ADA). Renella Cunas - Valid encounter within last 6 months    Recent Outpatient Visits           2 months ago Iron deficiency anemia, unspecified iron deficiency anemia type   [place], DO

## 2022-06-01 ENCOUNTER — Ambulatory Visit: Payer: BC Managed Care – PPO | Admitting: Family Medicine

## 2022-06-01 ENCOUNTER — Encounter: Payer: Self-pay | Admitting: Family Medicine

## 2022-06-01 VITALS — BP 115/70 | HR 85 | Ht 61.0 in | Wt 174.0 lb

## 2022-06-01 DIAGNOSIS — E65 Localized adiposity: Secondary | ICD-10-CM | POA: Diagnosis not present

## 2022-06-01 NOTE — Patient Instructions (Addendum)
Thank you for coming to the office today.  Axillary Fat Pad on LEFT, would need surgical intervention to remove.  Referral sent to Plastic Surgery through [facility].  [facility] [address] [place],  [place]  [address] Main: [phone]  Stay tuned for apt.   Please schedule a Follow-up Appointment to: Return if symptoms worsen or fail to improve.  If you have any other questions or concerns, please feel free to call the office or send a message through [place]. You may also schedule an earlier appointment if necessary.  Additionally, you may be receiving a survey about your experience at our office within a few days to 1 week by e-mail or mail. We value your feedback.  Nobie Putnam, DO [place]

## 2022-06-01 NOTE — Progress Notes (Signed)
Subjective:    Patient ID: Monica Simmons, female    DOB: 24-Nov-1975, 47 y.o.   MRN: [id number]  Monica Simmons is a 47 y.o. female presenting on 06/01/2022 for axillary excess skin   HPI  Axillary Fat Pad, Left Obesity BMI >32 [facility] s/p Gastric Sleeve surgery done 06/2014 History of prior weight up to 270 lbs prior to surgery She did reach her goal at wt 150 lbs  History of large axillary fat pad L arm, was present prior to bariatric surgery but has increased significantly in size and remains now with her weight loss, interested in surgical excision.      11/26/2020    3:23 PM  Depression screen PHQ 2/9  Decreased Interest 1  Down, Depressed, Hopeless 0  PHQ - 2 Score 1  Altered sleeping 0  Tired, decreased energy 1  Change in appetite 2  Feeling bad or failure about yourself  0  Trouble concentrating 0  Moving slowly or fidgety/restless 0  Suicidal thoughts 0  PHQ-9 Score 4  Difficult doing work/chores Not difficult at all    Social History   Tobacco Use   Smoking status: Former    Types: Cigarettes   Smokeless tobacco: Never  Substance Use Topics   Alcohol use: Yes    Comment: occasional   Drug use: No    Review of Systems Per HPI unless specifically indicated above     Objective:    BP 115/70   Pulse 85   Ht '5\' 1"'$  (1.549 m)   Wt 174 lb (78.9 kg)   SpO2 97%   BMI 32.88 kg/m   Wt Readings from Last 3 Encounters:  06/01/22 174 lb (78.9 kg)  11/26/20 182 lb 3.2 oz (82.6 kg)  02/20/14 245 lb (111.1 kg)    Physical Exam Vitals and nursing note reviewed.  Constitutional:      General: She is not in acute distress.    Appearance: Normal appearance. She is well-developed. She is not diaphoretic.     Comments: Well-appearing, comfortable, cooperative  HENT:     Head: Normocephalic and atraumatic.  Eyes:     General:        Right eye: No discharge.        Left eye: No discharge.     Conjunctiva/sclera: Conjunctivae normal.  Cardiovascular:      Rate and Rhythm: Normal rate.  Pulmonary:     Effort: Pulmonary effort is normal.  Skin:    General: Skin is warm and dry.     Findings: No erythema or rash.     Comments: Left axillary enlarged fat pad tissue 8 cm length, uncomplicated.  Neurological:     Mental Status: She is alert and oriented to person, place, and time.  Psychiatric:        Mood and Affect: Mood normal.        Behavior: Behavior normal.        Thought Content: Thought content normal.     Comments: Well groomed, good eye contact, normal speech and thoughts      Results for orders placed or performed in visit on 12/04/20  HM MAMMOGRAPHY  Result Value Ref Range   HM Mammogram 0-4 Bi-Rad 0-4 Bi-Rad, Self Reported Normal  HM PAP SMEAR  Result Value Ref Range   HM Pap smear      negative for intraepithelial lesion or malignancy, Negative HPV cotest      Assessment & Plan:   Problem List Items Addressed  This Visit   None Visit Diagnoses     Axillary fat pad    -  Primary   Relevant Orders   Ambulatory referral to Plastic Surgery       Axillary Fat Pad on LEFT, would need surgical intervention to remove.  Referral sent to Plastic Surgery through [facility].  [facility] [address] [place],  [place]  [address] Main: [phone]  Orders Placed This Encounter  Procedures   Ambulatory referral to Plastic Surgery    Referral Priority:   Routine    Referral Type:   Surgical    Referral Reason:   Specialty Services Required    Requested Specialty:   Plastic Surgery    Number of Visits Requested:   1     No orders of the defined types were placed in this encounter.     Follow up plan: Return if symptoms worsen or fail to improve.  Monica Simmons, [facility] 06/01/2022, 4:25 PM

## 2022-06-09 ENCOUNTER — Ambulatory Visit: Payer: BC Managed Care – PPO | Admitting: Family Medicine

## 2022-09-07 ENCOUNTER — Encounter: Payer: Self-pay | Admitting: Plastic Surgery

## 2022-09-07 ENCOUNTER — Ambulatory Visit (INDEPENDENT_AMBULATORY_CARE_PROVIDER_SITE_OTHER): Payer: BC Managed Care – PPO | Admitting: Plastic Surgery

## 2022-09-07 VITALS — BP 128/80 | HR 107 | Ht 61.0 in | Wt 168.8 lb

## 2022-09-07 DIAGNOSIS — R21 Rash and other nonspecific skin eruption: Secondary | ICD-10-CM | POA: Diagnosis not present

## 2022-09-07 DIAGNOSIS — Q838 Other congenital malformations of breast: Secondary | ICD-10-CM

## 2022-09-07 DIAGNOSIS — Z6841 Body Mass Index (BMI) 40.0 and over, adult: Secondary | ICD-10-CM

## 2022-09-07 DIAGNOSIS — Z903 Acquired absence of stomach [part of]: Secondary | ICD-10-CM

## 2022-09-07 DIAGNOSIS — N6459 Other signs and symptoms in breast: Secondary | ICD-10-CM | POA: Diagnosis not present

## 2022-09-07 NOTE — Progress Notes (Signed)
Patient ID: Monica Simmons, female    DOB: [birth year], 47 y.o.   MRN: [id number]   Chief Complaint  Patient presents with   Skin Problem   Breast Problem    The patient is a 47 year old female here for evaluation of her breasts and axillary tissue.  She is 5 feet 1 inch tall and weighs 168 pounds.  She had gastric bypass several years ago and lost 120 pounds.  She has significantly more axillary breast tissue on the left compared to the right side.  She seems to have very realistic expectations about improvement.  She gets rashes and skin breakdown between the skin folds and it is nearly impossible for her to hide it as it hangs over her bra.    Review of Systems  Constitutional: Negative.   HENT: Negative.    Eyes: Negative.   Respiratory: Negative.    Cardiovascular: Negative.   Gastrointestinal: Negative.   Endocrine: Negative.   Genitourinary: Negative.   Musculoskeletal: Negative.     Past Medical History:  Diagnosis Date   Anemia    Asthma    no inhaler   Heart murmur    Hypertension    Maternal anemia complicating pregnancy, childbirth, or the puerperium 11/30/2011   Postpartum care following cesarean delivery (8/5) 11/30/2011    Past Surgical History:  Procedure Laterality Date   CESAREAN SECTION     CESAREAN SECTION  11/29/2011   Procedure: CESAREAN SECTION;  Surgeon: Tresa Endo A. Ernestina Penna, MD;  Location: [facility];  Service: Gynecology;  Laterality: N/A;  Requests 75 min.  EDD: 12/16/11   MYOMECTOMY        Current Outpatient Medications:    Insulin Pen Needle (NOVOFINE PEN NEEDLE) 32G X 6 MM MISC, Use to inject Saxenda daily. (Patient not taking: Reported on 06/01/2022), Disp: 90 each, Rfl: 3   SAXENDA 18 MG/3ML SOPN, Injection 0.6 mg into skin once daily for 1 week, as tolerated increase by increment of 0.6mg  every 1 week, max dose is 3mg  injection daily after 5 weeks. (Patient not taking: Reported on 06/01/2022), Disp: 15 mL, Rfl: 0   Objective:   Vitals:   09/07/22  1422  BP: 128/80  Pulse: (!) 107  SpO2: 100%    Physical Exam Vitals and nursing note reviewed.  Constitutional:      Appearance: Normal appearance.  HENT:     Head: Normocephalic and atraumatic.  Cardiovascular:     Rate and Rhythm: Normal rate.     Pulses: Normal pulses.  Pulmonary:     Effort: Pulmonary effort is normal.  Abdominal:     Palpations: Abdomen is soft.  Musculoskeletal:        General: No swelling or deformity.  Skin:    General: Skin is warm.     Capillary Refill: Capillary refill takes less than 2 seconds.     Coloration: Skin is not jaundiced.     Findings: No bruising.  Neurological:     Mental Status: She is alert and oriented to person, place, and time.  Psychiatric:        Mood and Affect: Mood normal.        Behavior: Behavior normal.        Thought Content: Thought content normal.        Judgment: Judgment normal.     Assessment & Plan:  S/P gastric sleeve procedure  Morbid obesity with BMI of 40.0-44.9, adult (HCC)  Ectopic breast tissue  The patient is a good  candidate for excision of excess axillary/ectopic breast tissue.  Think she would benefit from this surgery.  Pictures were obtained of the patient and placed in the chart with the patient's or guardian's permission.   Alena Bills Charnele Semple, DO

## 2022-12-17 NOTE — Telephone Encounter (Signed)
LK left VM 8/14 and 8/20

## 2023-06-06 ENCOUNTER — Encounter: Payer: Self-pay | Admitting: Family Medicine

## 2023-06-06 ENCOUNTER — Other Ambulatory Visit: Payer: Self-pay | Admitting: Family Medicine

## 2023-06-06 ENCOUNTER — Ambulatory Visit: Payer: 59 | Admitting: Family Medicine

## 2023-06-06 ENCOUNTER — Other Ambulatory Visit: Payer: Self-pay

## 2023-06-06 VITALS — BP 122/80 | HR 82 | Ht 61.0 in | Wt 178.0 lb

## 2023-06-06 DIAGNOSIS — Z0000 Encounter for general adult medical examination without abnormal findings: Secondary | ICD-10-CM

## 2023-06-06 DIAGNOSIS — I10 Essential (primary) hypertension: Secondary | ICD-10-CM

## 2023-06-06 DIAGNOSIS — D509 Iron deficiency anemia, unspecified: Secondary | ICD-10-CM

## 2023-06-06 DIAGNOSIS — H60391 Other infective otitis externa, right ear: Secondary | ICD-10-CM | POA: Diagnosis not present

## 2023-06-06 DIAGNOSIS — E66811 Obesity, class 1: Secondary | ICD-10-CM

## 2023-06-06 MED ORDER — AMOXICILLIN-POT CLAVULANATE 875-125 MG PO TABS: 1.0000 | ORAL_TABLET | Freq: Two times a day (BID) | ORAL | 0 refills | Status: AC

## 2023-06-06 MED ORDER — CONTRAVE 8-90 MG PO TB12: ORAL_TABLET | ORAL | Status: AC

## 2023-06-06 NOTE — Patient Instructions (Addendum)
 Thank you for coming to the office today.  Rx Augmentin  antibiotic for 10 days for possible skin infection with the ear. After this, if it remains, likely some mixture of scar tissue / swollen tissue, it should reduce over time, but good idea to talk to the piercer and possibly dermatologist   For Weight Loss / Obesity only  INSURANCE - if covered  Saxenda  (daily injection) - this might be preferred. - Call and check cost / coverage, and let us  know  Zepbound (newest version, likely NOT preferred) TO Check Cost & Coverage of Zepbound Please contact Research officer, trade union (manufacturer for Zepbound) 1-800-LillyRx 3014201891) - Live agent to discuss cost and coverage.  YQMVHQ (2nd best, may not be preferred)  [email or web address]-[email or web address]  -------------------------   Other options  NOT THROUGH INSURANCE   Contrave  - oral medication, appetite suppression has wellbutrin/bupropion and naltrexone in it and it can also help with appetite, it is ordered through a speciality pharmacy. - $99 per month, mail order  Free sample 7 day, 1 pill per day for 1 week  Alternative options  Semaglutide injection (mixed Ozempic) from [facility] 0.25mg  weekly for 4 weeks then increase to 0.5mg  weekly It comes in a vial and a needle syringe, you need to draw up the shot and self admin it weekly Cost is about $200-300 per month Call them to check pricing and availability  [facility] Address: [address] [place], [place], [place] [address] Phone: [phone]  --------------------------------  [email or web address]  Materials engineer Team With insurance $79/mo + copay for medications Without insurance $79/mo + $99/mo to include medication (Semaglutide) Without insurance $79/mo + $199/mo to include medication (Tirzepatide) [email or web address]-[email or web address]    -----------------------------------    Please schedule a Follow-up Appointment to: Return for 3 month fasting lab > 1 week later Annual Physical.  If you have any other questions or concerns, please feel free to call the office or send a message through MyChart. You may also schedule an earlier appointment if necessary.  Additionally, you may be receiving a survey about your experience at our office within a few days to 1 week by e-mail or mail. We value your feedback.  Domingo Friend, DO [facility], [place]

## 2023-06-06 NOTE — Progress Notes (Signed)
 Subjective:    Patient ID: Monica Simmons, female    DOB: 06/22/1975, 48 y.o.   MRN: [id number]  Anessa Galanis is a 48 y.o. female presenting on 06/06/2023 for Obesity   HPI  Discussed the use of AI scribe software for clinical note transcription with the patient, who gave verbal consent to proceed.  History of Present Illness    Monica Simmons is a 48 year old female who presents with concerns about weight management and a possible ear infection.  She is concerned about weight management, having previously used phentermine to maintain her weight. While it helps in maintaining her current weight, it does not significantly aid in weight loss. She has tried semaglutide in the past but discontinued it. She is worried about her weight increasing and is determined not to return to her previous weight of 200 pounds. Her lowest weight was between 140-150 pounds following surgery in 2016. She is exploring other weight loss options due to insurance not covering certain medications.  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 The area appears infected, with swelling and occasional drainage, though it is not painful. She has a history of eczema but has not had previous issues with tattoos or piercings. The infection began about a month ago, and she suspects it may have been aggravated by the application of multiple products. She is concerned about the potential for keloid formation.          11/26/2020    3:23 PM  Depression screen PHQ 2/9  Decreased Interest 1  Down, Depressed, Hopeless 0  PHQ - 2 Score 1  Altered sleeping 0  Tired, decreased energy 1  Change in appetite 2  Feeling bad or failure about yourself  0  Trouble concentrating 0  Moving slowly or fidgety/restless 0  Suicidal  thoughts 0  PHQ-9 Score 4  Difficult doing work/chores Not difficult at all       11/26/2020    3:24 PM  GAD 7 : Generalized Anxiety Score  Nervous, Anxious, on Edge 0  Control/stop worrying 0  Worry too much - different things 0  Trouble relaxing 0  Restless 0  Easily annoyed or irritable 0  Afraid - awful might happen 0  Total GAD 7 Score 0  Anxiety Difficulty Not difficult at all    Social History   Tobacco Use   Smoking status: Former    Types: Cigarettes   Smokeless tobacco: Never  Substance Use Topics   Alcohol use: Yes    Comment: occasional   Drug use: No    Review of Systems Per HPI unless specifically indicated above     Objective:    BP 122/80   Pulse 82   Ht 5\' 1"  (1.549 m)   Wt 178 lb (80.7 kg)   SpO2 95%   BMI 33.63 kg/m   Wt Readings from Last 3 Encounters:  06/06/23 178 lb (80.7 kg)  09/07/22 168 lb 12.8 oz (76.6 kg)  06/01/22 174 lb (78.9 kg)    Physical Exam Vitals and nursing note reviewed.  Constitutional:      General: She is not in acute distress.    Appearance: Normal appearance. She is well-developed. She is not diaphoretic.     Comments: Well-appearing, comfortable, cooperative  HENT:     Head: Normocephalic and atraumatic.  Eyes:     General:        Right eye: No discharge.        Left eye: No discharge.     Conjunctiva/sclera: Conjunctivae normal.  Cardiovascular:     Rate and Rhythm: Normal rate.  Pulmonary:     Effort: Pulmonary effort is normal.  Skin:    General: Skin is warm and dry.     Findings: Lesion (R external ear helix with jewelry hardware with double cartilage piercing) present. No erythema or rash.  Neurological:     Mental Status: She is alert and oriented to person, place, and time.  Psychiatric:        Mood and Affect: Mood normal.        Behavior: Behavior normal.        Thought Content: Thought content normal.     Comments: Well groomed, good eye contact, normal speech and thoughts     Results  for orders placed or performed in visit on 12/04/20  [facility] PAP SMEAR   Collection Time: 05/14/19 12:00 AM  Result Value Ref Range   HM Pap smear      negative for intraepithelial lesion or malignancy, Negative HPV cotest  [facility] MAMMOGRAPHY   Collection Time: 07/23/20 12:00 AM  Result Value Ref Range   HM Mammogram 0-4 Bi-Rad 0-4 Bi-Rad, Self Reported Normal      Assessment & Plan:   Problem List Items Addressed This Visit     Obesity (BMI 30.0-34.9) - Primary   Other Visit Diagnoses       Chronic bacterial otitis externa of right ear       Relevant Medications   amoxicillin -clavulanate (AUGMENTIN ) 875-125 MG tablet       Obesity BMI >33 Weight Management Patient has been using phentermine for weight loss and maintenance but is concerned about weight regain. Discussed various weight loss medications including Wegovy, Zepbound, and Contrave . Patient was provided with information to check insurance coverage and costs for these medications. -Start Contrave  one week free sample. -Check insurance coverage and costs for Wegovy, Zepbound, and Contrave . -Plan to order phentermine for short-term use (max 3 months) if other options are not feasible.  Cartilage Piercing Infection Patient has a R upper helix cartilage piercing that appears to be infected previously. No severe symptoms, but some crusting and swelling noted. Discussed potential for scar tissue formation and need for consultation with piercer or dermatologist. -Start Augmentin  twice a day for 10 days. -Consult with piercer and possibly dermatologist if symptoms persist.  General Health Maintenance Discussed need for annual physical checkup including basic blood work. -Schedule annual physical checkup and basic blood work.         No orders of the defined types were placed in this encounter.   Meds ordered this encounter  Medications   amoxicillin -clavulanate (AUGMENTIN ) 875-125 MG tablet    Sig: Take 1 tablet by mouth 2  (two) times daily.    Dispense:  20 tablet    Refill:  0    Follow up plan: Return for 3 month fasting lab > 1 week later Annual Physical.  Future labs ordered for 09/03/23   Domingo Friend, DO [facility] 06/06/2023, 3:42 PM
